# Patient Record
Sex: Female | Born: 1975 | Hispanic: No | Marital: Single
Health system: Northeastern US, Academic
[De-identification: ages and names within clinical notes are randomized; demographics above are authoritative.]

---

## 2019-05-16 LAB — COMPREHENSIVE METABOLIC PANEL
BKR ALANINE AMINOTRANSFERASE (ALT): 14 U/L (ref 13–56)
BKR ALBUMIN: 4.3 g/dL (ref 3.4–5.0)
BKR ALKALINE PHOSPHATASE: 87 U/L (ref 45–117)
BKR ANION GAP (LM): 9 mmol/L (ref 5–15)
BKR ASPARTATE AMINOTRANSFERASE (AST): 9 U/L — ABNORMAL LOW (ref 15–37)
BKR BILIRUBIN TOTAL: 0.2 mg/dL (ref <1.0)
BKR BLOOD UREA NITROGEN: 7 mg/dL (ref 7–18)
BKR CALCIUM: 8.9 mg/dL (ref 8.5–10.1)
BKR CHLORIDE: 102 mmol/L (ref 98–107)
BKR CO2: 26 mmol/L (ref 21–32)
BKR CREATININE: 0.76 mg/dL (ref 0.55–1.02)
BKR EGFR (AFR AMER) (LMC): >60 mL/min/{1.73_m2} (ref >60)
BKR EGFR (NON AFR AMER) (LMC): >60 mL/min/{1.73_m2} (ref >60)
BKR GLOBULIN: 3.8 g/dL (ref 2.5–5.0)
BKR GLUCOSE: 158 mg/dL — ABNORMAL HIGH (ref 65–110)
BKR POTASSIUM: 3.3 mmol/L — ABNORMAL LOW (ref 3.5–5.1)
BKR PROTEIN TOTAL: 8.1 g/dL (ref 6.4–8.2)
BKR SODIUM: 137 mmol/L (ref 136–145)

## 2019-05-16 LAB — CBC WITH AUTO DIFFERENTIAL
BKR WAM ABSOLUTE IMMATURE GRANULOCYTES.: 0.04 x 1000/ÂµL (ref 0.00–0.10)
BKR WAM ABSOLUTE LYMPHOCYTE COUNT.: 3.11 x 1000/ÂµL (ref 1.00–4.50)
BKR WAM ABSOLUTE NEUTROPHIL COUNT.: 11.37 x 1000/ÂµL — ABNORMAL HIGH (ref 1.40–6.60)
BKR WAM BASOPHIL ABSOLUTE COUNT.: 0.07 x 1000/ÂµL (ref 0.0–0.3)
BKR WAM BASOPHILS: 0.5 % (ref 0.0–3.0)
BKR WAM EOSINOPHIL ABSOLUTE COUNT.: 0.16 x 1000/ÂµL (ref 0.00–0.45)
BKR WAM EOSINOPHILS: 1 % (ref 0.0–4.0)
BKR WAM HEMATOCRIT: 40 % (ref 34.0–45.0)
BKR WAM HEMOGLOBIN: 13.3 g/dL (ref 11.0–15.0)
BKR WAM IMMATURE GRANULOCYTES: 0.3 % (ref 0.0–1.0)
BKR WAM LYMPHOCYTES: 20 % — ABNORMAL LOW (ref 25.0–45.0)
BKR WAM MCH PG: 29.6 pg (ref 27–33)
BKR WAM MCHC: 33.3 g/dL (ref 32.0–36.0)
BKR WAM MCV: 88.9 fL (ref 79.0–99.0)
BKR WAM MONOCYTE ABSOLUTE COUNT.: 0.78 x 1000/ÂµL (ref 0.00–1.20)
BKR WAM MONOCYTES: 5 % (ref 0.0–12.0)
BKR WAM MPV: 8.8 fL (ref 7.5–11.5)
BKR WAM NEUTROPHILS: 73.2 % — ABNORMAL HIGH (ref 36.0–66.0)
BKR WAM NUCLEATED RED BLOOD CELLS: 0 % (ref 0.0–5.0)
BKR WAM PLATELETS: 407 x1000/ÂµL — ABNORMAL HIGH (ref 140–400)
BKR WAM RDW-CV: 14 % (ref 11.5–14.5)
BKR WAM RED BLOOD CELL COUNT.: 4.5 M/ÂµL (ref 4.00–5.20)
BKR WAM WHITE BLOOD CELL COUNT.: 15.53 x1000/ÂµL — ABNORMAL HIGH (ref 4.00–10.00)

## 2019-05-16 LAB — URINALYSIS WITH CULTURE REFLEX      (BH LMW YH)
BKR BILIRUBIN, UA MG/DL: NEGATIVE mg/dL
BKR GLUCOSE, UA MG/DL: NEGATIVE mg/dL
BKR KETONES, UA MG/DL: NEGATIVE mg/dL
BKR LEUKOCYTE ESTERASE, UA: NEGATIVE Leu/uL
BKR NITRITE, UA: NEGATIVE
BKR PH, UA: 8 — ABNORMAL HIGH (ref 5.0–7.0)
BKR PROTEIN, UA.: NEGATIVE mg/dL
BKR RBC/HPF INSTRUMENT: 3 /HPF (ref 0–3)
BKR SPECIFIC GRAVITY, UA: 1.006 (ref 1.003–1.035)
BKR URINE SQUAMOUS EPITHELIAL CELLS, UA (NUMERIC): 7 /HPF — ABNORMAL HIGH (ref 0–5)
BKR UROBILINOGEN, UA - ALPHA: NEGATIVE EU/dL
BKR WBC/HPF INSTRUMENT: <1 /HPF (ref 0–5)

## 2019-05-16 LAB — LIPASE: BKR LIPASE: 6738 U/L — ABNORMAL HIGH (ref 73–393)

## 2019-05-16 LAB — LIPID PANEL
BKR CHOLESTEROL: 183 mg/dL
BKR HDL CHOLESTEROL: 38 mg/dL — ABNORMAL LOW
BKR LDL CHOLESTEROL CALCULATED: 104 mg/dL — ABNORMAL HIGH
BKR TRIGLYCERIDES: 204 mg/dL — ABNORMAL HIGH

## 2019-05-16 LAB — SARS COV-2 (COVID-19) RNA: BKR SARS-COV-2 RNA (COVID-19) (YH): NEGATIVE

## 2019-05-16 LAB — HCG, SERUM, QUALITATIVE (BH GH L LMW): BKR SERUM PREGNANCY-QUALITATIVE: NEGATIVE

## 2019-05-17 LAB — LIPASE: BKR LIPASE: 99 U/L (ref 73–393)

## 2019-05-17 LAB — COMPREHENSIVE METABOLIC PANEL
BKR ALANINE AMINOTRANSFERASE (ALT): 18 U/L (ref 13–56)
BKR ALBUMIN: 3.8 g/dL (ref 3.4–5.0)
BKR ALKALINE PHOSPHATASE: 80 U/L (ref 45–117)
BKR ANION GAP (LM): 6 mmol/L (ref 5–15)
BKR ASPARTATE AMINOTRANSFERASE (AST): 15 U/L (ref 15–37)
BKR BILIRUBIN TOTAL: 0.3 mg/dL (ref <1.0)
BKR BLOOD UREA NITROGEN: 4 mg/dL — ABNORMAL LOW (ref 7–18)
BKR CALCIUM: 9.5 mg/dL (ref 8.5–10.1)
BKR CHLORIDE: 106 mmol/L (ref 98–107)
BKR CO2: 24 mmol/L (ref 21–32)
BKR CREATININE: 0.61 mg/dL (ref 0.55–1.02)
BKR EGFR (AFR AMER) (LMC): >60 mL/min/{1.73_m2} (ref >60)
BKR EGFR (NON AFR AMER) (LMC): >60 mL/min/{1.73_m2} (ref >60)
BKR GLOBULIN: 3.6 g/dL (ref 2.5–5.0)
BKR GLUCOSE: 89 mg/dL (ref 65–110)
BKR POTASSIUM: 4.2 mmol/L (ref 3.5–5.1)
BKR PROTEIN TOTAL: 7.4 g/dL (ref 6.4–8.2)
BKR SODIUM: 136 mmol/L (ref 136–145)

## 2019-05-17 LAB — CBC WITH AUTO DIFFERENTIAL
BKR WAM ABSOLUTE IMMATURE GRANULOCYTES.: 0.03 x 1000/ÂµL (ref 0.00–0.10)
BKR WAM ABSOLUTE LYMPHOCYTE COUNT.: 2.55 x 1000/ÂµL (ref 1.00–4.50)
BKR WAM ABSOLUTE NEUTROPHIL COUNT.: 7.03 x 1000/ÂµL — ABNORMAL HIGH (ref 1.40–6.60)
BKR WAM BASOPHIL ABSOLUTE COUNT.: 0.07 x 1000/ÂµL (ref 0.0–0.3)
BKR WAM BASOPHILS: 0.7 % (ref 0.0–3.0)
BKR WAM EOSINOPHIL ABSOLUTE COUNT.: 0.19 x 1000/ÂµL (ref 0.00–0.45)
BKR WAM EOSINOPHILS: 1.8 % (ref 0.0–4.0)
BKR WAM HEMATOCRIT: 37.6 % (ref 34.0–45.0)
BKR WAM HEMOGLOBIN: 12.1 g/dL (ref 11.0–15.0)
BKR WAM IMMATURE GRANULOCYTES: 0.3 % (ref 0.0–1.0)
BKR WAM LYMPHOCYTES: 24.7 % — ABNORMAL LOW (ref 25.0–45.0)
BKR WAM MCH PG: 29.7 pg (ref 27–33)
BKR WAM MCHC: 32.2 g/dL (ref 32.0–36.0)
BKR WAM MCV: 92.4 fL (ref 79.0–99.0)
BKR WAM MONOCYTE ABSOLUTE COUNT.: 0.44 x 1000/ÂµL (ref 0.00–1.20)
BKR WAM MONOCYTES: 4.3 % (ref 0.0–12.0)
BKR WAM MPV: 9 fL (ref 7.5–11.5)
BKR WAM NEUTROPHILS: 68.2 % — ABNORMAL HIGH (ref 36.0–66.0)
BKR WAM NUCLEATED RED BLOOD CELLS: 0 % (ref 0.0–5.0)
BKR WAM PLATELETS: 333 x1000/ÂµL (ref 140–400)
BKR WAM RDW-CV: 13.8 % (ref 11.5–14.5)
BKR WAM RED BLOOD CELL COUNT.: 4.07 M/ÂµL (ref 4.00–5.20)
BKR WAM WHITE BLOOD CELL COUNT.: 10.31 x1000/ÂµL — ABNORMAL HIGH (ref 4.00–10.00)

## 2019-05-26 LAB — CANCER ANTIGEN 19-9: CA 19-9: 16 U/mL (ref <34)

## 2019-05-26 LAB — IMMUNOGLOBULIN G SUBCLASS 4     (BH GH LMW Q YH): IMMUNOGLOBULIN G SUBCLASS 4: 13.4 mg/dL (ref 4.0–86.0)

## 2019-06-01 LAB — PANCREATIC ELASTASE-1     (BH GH LMW Q YH): PANCREATIC ELASTASE-1: 375 mcg/g

## 2019-06-02 ENCOUNTER — Ambulatory Visit: Admit: 2019-06-02 | Payer: PRIVATE HEALTH INSURANCE

## 2019-06-16 ENCOUNTER — Encounter: Admit: 2019-06-16 | Payer: PRIVATE HEALTH INSURANCE | Attending: Adult Health

## 2019-06-16 ENCOUNTER — Ambulatory Visit: Admit: 2019-06-16 | Payer: PRIVATE HEALTH INSURANCE | Attending: Family

## 2019-06-16 DIAGNOSIS — K852 Alcohol induced acute pancreatitis without necrosis or infection: Secondary | ICD-10-CM

## 2019-06-16 DIAGNOSIS — F17200 Nicotine dependence, unspecified, uncomplicated: Secondary | ICD-10-CM

## 2019-06-16 DIAGNOSIS — K863 Pseudocyst of pancreas: Secondary | ICD-10-CM

## 2019-06-16 DIAGNOSIS — Z01818 Encounter for other preprocedural examination: Secondary | ICD-10-CM

## 2019-06-16 NOTE — Progress Notes
VIDEO TELEHEALTH VISIT: This clinician is part of the telehealth program and is conducting this visit in a currently approved location. For this visit the clinician and patient were present via interactive audio & video telecommunications system that permits real-time communications.Patient/parent or guardian consent given for video visit: Yes State patient is located in: [place]The clinician is appropriately licensed in the above state to provide care for this visit. Other individuals present during the telehealth encounter and their role/relation: noneIf billing based on time, please complete (Not required if billing based on MDM):                           Total time spent in medical video consultation: ; Total time spent by the provider on the day of service, which includes time spent on chart review, medical video consultation, education, coordination of care/services and counseling Because this visit was completed over video, a hands-on physical exam was not performed.  Patient/parent or guardian understands and knows to call back if condition changes.Natasha English  1. Alcohol-induced acute pancreatitis without infection or necrosis  K85.20  2. Pseudocyst of pancreas  K86.3  3. Tobacco dependence  F17.200   HPI: Natasha English is a 44 y.o. female being interviewed today due to 1. Alcohol-induced acute pancreatitis without infection or necrosis   2. Pseudocyst of pancreas   3. Tobacco dependence    Pt reports that she was drinking heavily for a couple years starting about 3-4 years ago.  She began with attacks of acute pancreatitis in April 2019.  She cut down drinking in Dec 2019 and has since stopped completely.  She has been admitted with pancreatitis 7-8x since the attacks began (2019).  She has already been admitted 2x since Dec 2020.  Her pain resolves completely between attacks. She has no steattorea or problems with glucose control.  She reports her last HgbA1c at 5.8.  She has no personal history of pancreas issues prior to April 2019.  No family history of pancreas issues.She does have a history of high triglicerides that is being treated effectively with medication.IgG4 normalCa-19-9 normalCyst in head of pancreas was first seen in pancreas in April 2020 at 1.5cm.  It has now grown to 2.0cm.  She has had multiple episodes of pancreatitis within this time.  Pt no longer drinks alcohol but does admit to smoking a few cigarettes per day.  She is trying to cut down.Medical History: PMH PSH Past Medical History: Diagnosis Date ? Essential hypertension 10/12/2009 ? High cholesterol  ? History of abnormal cervical Papanicolaou smear  ? History of pulmonary embolism  ? Pancreatitis  ? Paroxysmal ventricular tachycardia (HC Code) 10/12/2009  RT OUTFLOW TRACT  Past Surgical History: Procedure Laterality Date ? CARDIAC ELECTROPHYSIOLOGY MAPPING AND ABLATION  04/23/2008  [place] ? CERVICAL CONE BIOPSY   ? CESAREAN SECTION   ? MIBI Exercise stress/test (NM)  10/08/2007  [facility]&M ? MOUTH SURGERY   ? TONSILLECTOMY   ? TUBAL LIGATION    Social History Family History Social History Tobacco Use ? Smoking status: Current Some Day Smoker ? Smokeless tobacco: Never Used Substance Use Topics ? Alcohol use: Yes   Comment: daily 2-3 drinks/day  Family History Problem Relation Age of Onset ? Heart disease Father       Myocardial Infarction ? No Known Problems Mother  ? Breast cancer Maternal Grandmother   Medications Current Outpatient Medications (CARDIAC DRUGS)  Medication Sig ? amLODIPine Take 1 tablet (10 mg total) by mouth daily. Current Outpatient Medications (CARDIOVASCULAR) Medication Sig ? atorvastatin Take 20 mg by mouth daily.. Current Outpatient Medications (CNS DRUGS) Medication Sig ? lamoTRIgine Take 100 mg by mouth daily. With 50 mg total dose = 150 mg daily Current Outpatient Medications (PSYCHOTHERAPEUTIC DRUGS) Medication Sig ? buPROPion SR Take 300 mg by mouth daily.  Current Outpatient Medications (GASTROINTESTINAL) Medication Sig ? ondansetron Place 4 mg onto the tongue every 8 (eight) hours as needed for nausea. ? pantoprazole Take 1 tablet (40 mg total) by mouth every 12 (twelve) hours. (Patient taking differently: Take 40 mg by mouth daily. ) ? Vascepa TAKE 2 TABLETS BY MOUTH TWICE A DAY Current Outpatient Medications (PRE-NATAL VITAMINS) Medication Sig ? Prenatal Vitamin Plus Low Iron Take 1 tablet by mouth daily. *OTC   Allergies Allergies Allergen Reactions ? Lisinopril Swelling   Angioedema on May 11, 2018 ? Hydrochlorothiazide    Electrolyte imbalance  Family History: non-contributoryReview of Systems: ROS:   No SOB or CPNo fever, weight lossNo abdominal pain, diarrhea or constipationNo rashNo arthralgias or myalgiasNo behavioral issuesNo headache or weaknessObjective: Limited physical exam was performed due to teleheatlh visit. There were no vitals filed for this visit.  Physical Exam:Gen: pleasant in NADRespirations: normal effortNeuro: AxOx3 Skin shows no signs of jaundiceResults for orders placed or performed in visit on 05/24/19 Pancreatic elastase-1     (BH GH LMW Q YH) Result Value Ref Range  Pancreatic Elastase-1 375 mcg/g [place] ABDOMEN W WO IV CONTRAST?Natasha English       SexCarmon English  DOB: [id number]  MRN: [id number]?Service Date: 2019-04-16 14:55:47?[place]mer of the abdomen without and with nonionic intravenous contrast - oral contrast: Yes?Radiation dose lowering was achieved by the use of an iterative reconstruction technique.?HISTORY: Pancreatitis suspected?COMPARISON: 21 October 2018 and 02 August 2018 MRI studies of the abdomen, and 01 August 2018, 05 May 2018 and 01 Sep 2017 [place] studies of the abdomen/pelvis.?Pre IV contrast:  No apparent visceral calcification or finding to suggest hemorrhage.?Lung bases: Clear. The heart is normal in size.?Liver: Unremarkable.?Spleen: Unremarkable.?Pancreas: There is mild infiltration of the mesenteric fat along the head and body region, consistent with pancreatitis. The previously noted rounded hypodensity in the head region, potentially representing a pseudocyst, now measures 2.0 cm in maximum diameter, compared with 1.8 cm on the June 2020 and 1.5 cm on the April 2020 MRI studies. The pancreatic duct remains moderately distended.?Gallbladder: Unremarkable.?Kidneys: Enhance symmetrically, without findings to suggest obstruction or mass. ?Adrenals/lymph nodes: No adrenal mass or lymphadenopathy.?Mesentery/vessels: A small umbilical hernia contains only fat.?Skin/bones/subcutaneous tissues: No apparent aggressive bony abnormality.?Visualized bowel and pelvis: Unremarkable. What appears to be the partially visualized appendix appears normal.?IMPRESSION:?Findings consistent with mild, uncomplicated pancreatitis.?Mild interval enlargement of a pancreatic head hypodensity, potentially representing a pseudocyst. A follow-up endoscopic ultrasound and biopsy should be considered because of the possibility of malignancy. Alternatively, a follow-up MRI of the abdomen without and with intravenous contrast, focusing on the pancreas, may be helpful.?Estimated radiation exposure: 422.79 mGy.cm?[id number]?Signed By: Nelva Bush MD, 2019/04/16 16:21:21Assessment: Natasha English is a 44 y.o. female being interviewed today for 1. Alcohol-induced acute pancreatitis without infection or necrosis   2. Pseudocyst of pancreas   3. Tobacco dependence   Plan:   English  1. Alcohol-induced acute pancreatitis without infection or necrosis  K85.20  2. Pseudocyst of pancreas  K86.3  3. Tobacco dependence  F17.200   -Recommendations: 1. 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  2.  Pancreas cysts discussed in detail with patient including common kinds including but not limited to: Serous cysts,- contains simple fluid, no malignant potential, more common in middle aged women.  Can grow with time and press on surrounding organs-- only at that time would we consider intervention.   Post inflammatory or pseudocysts - can occur after pancreatitis when body is trying to reorganize and absorb inflammatory fluid from pancreas,  No malignant potential, body tends to reabsorb over time, occasionally requires intervention.   Mucinous cysts-  includes MCN or IPMN (intrapappillary mucinous neoplasm), malignant potential, filled with mucin- thick sticky liquid.  We worry when there are concerning features such as nodular appearance, involvement of main duct of pancreas or high growth rate over time.  If we see concerning features, we may advise intervention such as surgery.Because pancreas surgery is very complicated, benefits and risks must be weighed and considered before surgery would ever be advised.  At this time, there are no worrisome features to suggest surgery would benefit currently.  We will gather more information to determine the nature of this cyst.    Recommend we proceed with [facility] within the next 2 weeks so that we can get more data to help determine cyst features and what surveillance or treatment path would be.  Risks and benefits of EUS discussed with pt including but not limited to bleeding, perforation, infection, pancreatitis and reaction to sedation.  Pt is agreeable to these risks and wishes to proceed. Once EUS/FNA is performed we will have better idea what kind of cyst this is and if any worrisome features are seen that would suggest surgery, vs no worrisome features and would suggest surveillance with imaging.  3. I discussed tobacco cessation with the pt, she is already cutting down to a few cigarettes each day and is on Wellbutrin.  Reminded her that the tobacco can contribute to the irritation/scarring on pancreas for those who have already had pancreas issues.  She agrees this is another good reason to continue cutting down.  *I will ask our staff to send cardiac clearance to Dr Grier Rocher due to pt's history of paroxismal vtach and chest pain.*Pt is scheduled for the EUS with Dr Mauro Kaufmann on 2/26, she will need a telehealth visit with me 1 week following to discuss results and recommendations based on the procedure.Thank you for the kind referral.  We greatly appreciate the opportunity to assist in your patient's care.  If there are further questions, please do not hesitate to contact us.Best Regards,Electronically Signed by Daliah Chaudoin [place] Jan Olano, APRN, February 15, 2021[facility] APRN MSN/MPHNurse Practitioner Clinical CoordinatorAdvanced EndoscopyDigestive DiseaseYale UniversityPhone [phone]

## 2019-06-17 ENCOUNTER — Encounter: Admit: 2019-06-17 | Payer: PRIVATE HEALTH INSURANCE

## 2019-06-17 ENCOUNTER — Inpatient Hospital Stay: Admit: 2019-06-17 | Discharge: 2019-06-17 | Payer: BLUE CROSS/BLUE SHIELD

## 2019-06-17 DIAGNOSIS — Z8742 Personal history of other diseases of the female genital tract: Secondary | ICD-10-CM

## 2019-06-17 DIAGNOSIS — I4729 Paroxysmal ventricular tachycardia (HC Code): Secondary | ICD-10-CM

## 2019-06-17 DIAGNOSIS — R928 Other abnormal and inconclusive findings on diagnostic imaging of breast: Secondary | ICD-10-CM

## 2019-06-17 DIAGNOSIS — N6489 Other specified disorders of breast: Secondary | ICD-10-CM

## 2019-06-17 DIAGNOSIS — I10 Essential (primary) hypertension: Secondary | ICD-10-CM

## 2019-06-17 DIAGNOSIS — Z86711 Personal history of pulmonary embolism: Secondary | ICD-10-CM

## 2019-06-17 DIAGNOSIS — K859 Acute pancreatitis without necrosis or infection, unspecified: Secondary | ICD-10-CM

## 2019-06-17 DIAGNOSIS — E7800 Pure hypercholesterolemia, unspecified: Secondary | ICD-10-CM

## 2019-06-24 ENCOUNTER — Encounter: Admit: 2019-06-24 | Payer: PRIVATE HEALTH INSURANCE | Attending: Gastroenterology

## 2019-06-24 ENCOUNTER — Inpatient Hospital Stay: Admit: 2019-06-24 | Discharge: 2019-06-24 | Payer: BLUE CROSS/BLUE SHIELD

## 2019-06-24 DIAGNOSIS — I2699 Other pulmonary embolism without acute cor pulmonale: Secondary | ICD-10-CM

## 2019-06-24 DIAGNOSIS — Z01812 Encounter for preprocedural laboratory examination: Secondary | ICD-10-CM

## 2019-06-24 DIAGNOSIS — K859 Acute pancreatitis without necrosis or infection, unspecified: Secondary | ICD-10-CM

## 2019-06-24 DIAGNOSIS — Z01818 Encounter for other preprocedural examination: Secondary | ICD-10-CM

## 2019-06-24 DIAGNOSIS — I4729 Paroxysmal ventricular tachycardia (HC Code): Secondary | ICD-10-CM

## 2019-06-24 DIAGNOSIS — Z8742 Personal history of other diseases of the female genital tract: Secondary | ICD-10-CM

## 2019-06-24 DIAGNOSIS — Z20822 Contact with and (suspected) exposure to covid-19: Secondary | ICD-10-CM

## 2019-06-24 DIAGNOSIS — E7800 Pure hypercholesterolemia, unspecified: Secondary | ICD-10-CM

## 2019-06-24 DIAGNOSIS — K862 Cyst of pancreas: Secondary | ICD-10-CM

## 2019-06-24 DIAGNOSIS — I10 Essential (primary) hypertension: Secondary | ICD-10-CM

## 2019-06-25 ENCOUNTER — Encounter: Admit: 2019-06-25 | Payer: PRIVATE HEALTH INSURANCE | Attending: Gastroenterology

## 2019-06-25 ENCOUNTER — Ambulatory Visit: Admit: 2019-06-25 | Payer: PRIVATE HEALTH INSURANCE | Attending: Family

## 2019-06-25 DIAGNOSIS — R569 Unspecified convulsions: Secondary | ICD-10-CM

## 2019-06-25 DIAGNOSIS — I10 Essential (primary) hypertension: Secondary | ICD-10-CM

## 2019-06-25 DIAGNOSIS — K859 Acute pancreatitis without necrosis or infection, unspecified: Secondary | ICD-10-CM

## 2019-06-25 DIAGNOSIS — Z5189 Encounter for other specified aftercare: Secondary | ICD-10-CM

## 2019-06-25 DIAGNOSIS — K862 Cyst of pancreas: Secondary | ICD-10-CM

## 2019-06-25 DIAGNOSIS — I2699 Other pulmonary embolism without acute cor pulmonale: Secondary | ICD-10-CM

## 2019-06-25 DIAGNOSIS — Z8742 Personal history of other diseases of the female genital tract: Secondary | ICD-10-CM

## 2019-06-25 DIAGNOSIS — E7800 Pure hypercholesterolemia, unspecified: Secondary | ICD-10-CM

## 2019-06-25 DIAGNOSIS — K861 Other chronic pancreatitis: Secondary | ICD-10-CM

## 2019-06-25 DIAGNOSIS — I4729 Paroxysmal ventricular tachycardia (HC Code): Secondary | ICD-10-CM

## 2019-06-25 LAB — COVID-19 CLEARANCE OR FOR PLACEMENT ONLY: BKR SARS-COV-2 RNA (COVID-19) (YH): NEGATIVE

## 2019-06-25 NOTE — Other
Pt scheduled for a procedure at [facility] on 06/27/[facility]. Pre procedure call complete. Covid test complete Pt instructed to take Lamictal,Amlodipine,Protonix and Wellbutrin by 7am morning of procedure with sip H2O   Discharge transportation policy and NPO instructions given per pre procedure guidelines Verbalizing understanding of instructions. Encouraged to call back with any questions.

## 2019-06-25 NOTE — Other
ReviewReview NP: Otilio Saber

## 2019-06-27 ENCOUNTER — Ambulatory Visit: Admit: 2019-06-27 | Payer: PRIVATE HEALTH INSURANCE | Attending: Family

## 2019-06-27 ENCOUNTER — Inpatient Hospital Stay: Admit: 2019-06-27 | Discharge: 2019-06-27 | Payer: BLUE CROSS/BLUE SHIELD

## 2019-06-27 ENCOUNTER — Encounter: Admit: 2019-06-27 | Payer: PRIVATE HEALTH INSURANCE | Attending: Gastroenterology

## 2019-06-27 DIAGNOSIS — E7800 Pure hypercholesterolemia, unspecified: Secondary | ICD-10-CM

## 2019-06-27 DIAGNOSIS — F329 Major depressive disorder, single episode, unspecified: Secondary | ICD-10-CM

## 2019-06-27 DIAGNOSIS — I4729 Paroxysmal ventricular tachycardia (HC Code): Secondary | ICD-10-CM

## 2019-06-27 DIAGNOSIS — I10 Essential (primary) hypertension: Secondary | ICD-10-CM

## 2019-06-27 DIAGNOSIS — F17210 Nicotine dependence, cigarettes, uncomplicated: Secondary | ICD-10-CM

## 2019-06-27 DIAGNOSIS — K862 Cyst of pancreas: Secondary | ICD-10-CM

## 2019-06-27 DIAGNOSIS — F988 Other specified behavioral and emotional disorders with onset usually occurring in childhood and adolescence: Secondary | ICD-10-CM

## 2019-06-27 DIAGNOSIS — Z888 Allergy status to other drugs, medicaments and biological substances status: Secondary | ICD-10-CM

## 2019-06-27 DIAGNOSIS — I2699 Other pulmonary embolism without acute cor pulmonale: Secondary | ICD-10-CM

## 2019-06-27 DIAGNOSIS — Z79899 Other long term (current) drug therapy: Secondary | ICD-10-CM

## 2019-06-27 DIAGNOSIS — Z5189 Encounter for other specified aftercare: Secondary | ICD-10-CM

## 2019-06-27 DIAGNOSIS — Z8742 Personal history of other diseases of the female genital tract: Secondary | ICD-10-CM

## 2019-06-27 DIAGNOSIS — R569 Unspecified convulsions: Secondary | ICD-10-CM

## 2019-06-27 DIAGNOSIS — K8689 Other specified diseases of pancreas: Secondary | ICD-10-CM

## 2019-06-27 DIAGNOSIS — K861 Other chronic pancreatitis: Secondary | ICD-10-CM

## 2019-06-27 DIAGNOSIS — Z86711 Personal history of pulmonary embolism: Secondary | ICD-10-CM

## 2019-06-27 DIAGNOSIS — K859 Acute pancreatitis without necrosis or infection, unspecified: Secondary | ICD-10-CM

## 2019-06-27 MED ORDER — FENTANYL (PF) 50 MCG/ML INJECTION SOLUTION
50 mcg/mL | Status: DC | PRN
Start: 2019-06-27 — End: 2019-06-27
  Administered 2019-06-27 (×2): 50 mcg/mL via INTRAVENOUS

## 2019-06-27 MED ORDER — SIMETHICONE 40 MG/0.6 ML ORAL DROPS,SUSPENSION
400.6 mg/0.6 mL | Status: DC | PRN
Start: 2019-06-27 — End: 2019-06-27

## 2019-06-27 MED ORDER — PROPOFOL 10 MG/ML INTRAVENOUS EMULSION
10 mg/mL | Status: DC | PRN
Start: 2019-06-27 — End: 2019-06-27
  Administered 2019-06-27: 18:00:00 10 mL/h via INTRAVENOUS

## 2019-06-27 MED ORDER — SODIUM CHLORIDE 0.9 % INTRAVENOUS SOLUTION
Status: DC | PRN
Start: 2019-06-27 — End: 2019-06-27
  Administered 2019-06-27: 18:00:00 via INTRAVENOUS

## 2019-06-27 MED ORDER — SODIUM CHLORIDE 0.9 % (FLUSH) INJECTION SYRINGE
0.9 % | Freq: Three times a day (TID) | INTRAVENOUS | Status: DC
Start: 2019-06-27 — End: 2019-06-27

## 2019-06-27 MED ORDER — FENTANYL (PF) 50 MCG/ML INJECTION SOLUTION
50 mcg/mL | Status: CP
Start: 2019-06-27 — End: ?

## 2019-06-27 MED ORDER — LACTATED RINGERS INTRAVENOUS SOLUTION
INTRAVENOUS | Status: DC
Start: 2019-06-27 — End: 2019-06-27

## 2019-06-27 MED ORDER — GLYCOPYRROLATE 0.2 MG/ML INJECTION SOLUTION
0.2 mg/mL | Status: DC | PRN
Start: 2019-06-27 — End: 2019-06-27
  Administered 2019-06-27: 18:00:00 0.2 mg/mL via INTRAVENOUS

## 2019-06-27 MED ORDER — PROPOFOL 10 MG/ML INTRAVENOUS EMULSION
10 mg/mL | Status: CP
Start: 2019-06-27 — End: ?

## 2019-06-27 MED ORDER — LIDOCAINE (PF) 20 MG/ML (2 %) INJECTION SOLUTION
20 mg/mL (2 %) | Status: DC | PRN
Start: 2019-06-27 — End: 2019-06-27
  Administered 2019-06-27: 18:00:00 20 mg/mL (2 %) via INTRAVENOUS

## 2019-06-27 MED ORDER — PROPOFOL 10 MG/ML INTRAVENOUS EMULSION
10 mg/mL | Status: DC | PRN
Start: 2019-06-27 — End: 2019-06-27
  Administered 2019-06-27 (×5): 10 mg/mL via INTRAVENOUS

## 2019-06-27 MED ORDER — SODIUM CHLORIDE 0.9 % (FLUSH) INJECTION SYRINGE
0.9 % | Status: DC | PRN
Start: 2019-06-27 — End: 2019-06-27

## 2019-06-27 NOTE — Other
Post Anesthesia Transfer of Care NotePatient: Natasha L SochaProcedure(s) Performed: Procedure(s) (LRB):EUS (N/A) Patient location: PACU Last Vitals: Vitals Value Taken Time BP 118/63 06/27/19 1345 Temp 36.4 ?C 06/27/19 1345 Pulse 81 06/27/19 1346 Resp 14 06/27/19 1346 SpO2 99 % 06/27/19 1346 Vitals shown include unvalidated device data.Level of consciousness: awakeTransport Vital Signs:  Stable since the last set of recorded intra-operative vital signsComplications: noneIntra-operative Intake & Output and Antibiotics as per Anesthesia record and discussed with the RN.

## 2019-06-27 NOTE — Discharge Instructions
Endoscopy & ColonoscopyAfter Care In case of any problems after your procedure, please call [phone] or go to the nearest Emergency Room for any urgent or severe symptoms. Please call for biopsy or cytology results within 5-7 days [phone] see copy of procedure report.HOME CARE INSTRUCTIONSActivity?	You may resume your regular activity but take frequent rest periods for the next 24 hours.?	You may experience abdominal discomfort such as a feeling of fullness or gas pains.?	Walking will help expel (get rid of) the air and reduce the bloated feeling in your abdomen.?	You may experience a sore throat for 2 to 3 days (if you had an upper endoscopy). This is normal. Gargling with salt water may help this.?	You may shower.?	A companion MUST drive you home, as the sedation impairs your reflexes and judgments.?	For the remainder of the day, you should not operate any vehicle or heavy machinery or make important decisions due to the sedation.  We recommend resting quietly.Nutrition?	Drink plenty of fluids.?	Begin with a light meal and progress to your normal diet as tolerated.?	Avoid alcoholic beverages for 24 hours or as instructed by your caregiver.Medications?	You may resume your normal medications unless your caregiver tells you otherwise.Follow-up* Please check out with the front desk [phone] before leaving to see if and when your follow up should be scheduled. You will be called within 5-7 business days to schedule a telehealth appointment with our clinicians to review the biopsy/cytology results of your procedure.  If you do not hear from anyone or if you are concerned, please call  [phone].SEEK IMMEDIATE MEDICAL CARE IF:?	You have excessive nausea (feeling sick to your stomach) and/or vomiting.?	You have severe abdominal pain and distention (swelling).?	You have trouble swallowing.?	You have a temperature over 100? F (37.8? C).?	You have rectal bleeding, black tarry stools or vomiting of blood.?	You are unable to urinate or pass bowel movements.Drs. Mauro Kaufmann, Kerby Less & Muniraj want you to have the best procedure experience possible.Please call with any questions, concerns or problems.M-F 8:30 am-4:30 pm at [phone]________________________________________________________________________July 2017You had a cyst in the head of the panceas probably from pancreatitis., Your pancreatic duct is enlarged. We would like to repeat your MRI in 2 months and see you in the office\

## 2019-06-27 NOTE — H&P
[facility]	 [facility]	Gastroenterology History & PhysicalHPI: Pancreatic cystMedical History: PMH PSH Past Medical History: Diagnosis Date ? Encounter for blood transfusion   during 2nd pregnancy ? Essential hypertension 10/12/2009 ? High cholesterol  ? History of abnormal cervical Papanicolaou smear  ? Pancreatic cyst 07/2018 ? Pancreatitis  ? Paroxysmal ventricular tachycardia (HC Code) 10/12/2009  RT OUTFLOW TRACT/ ablations x 6 ? Pulmonary embolism (HC Code) 2008  no blood thinners at this time ? Seizures (HC Code)   during pregnancy  Past Surgical History: Procedure Laterality Date ? CARDIAC ELECTROPHYSIOLOGY MAPPING AND ABLATION  04/23/2008  [place] ? CERVICAL CONE BIOPSY   ? CESAREAN SECTION   ? MIBI Exercise stress/test (NM)  10/08/2007  [facility]&M ? MOUTH SURGERY   ? TONSILLECTOMY   ? TUBAL LIGATION   ? UPPER GASTROINTESTINAL ENDOSCOPY    Social History Family History Social History Tobacco Use ? Smoking status: Current Some Day Smoker   Packs/day: 0.50   Types: Cigarettes ? Smokeless tobacco: Never Used Substance Use Topics ? Alcohol use: Not Currently   Comment: daily 2-3 drinks/day  Family History Problem Relation Age of Onset ? Heart disease Father       Myocardial Infarction ? No Known Problems Mother  ? Breast cancer Maternal Grandmother   Prior to Admission Medications Medications Prior to Admission Medication Sig Dispense Refill Last Dose ? amLODIPine (NORVASC) 10 mg tablet Take 1 tablet (10 mg total) by mouth daily. 30 tablet 0  ? atorvastatin (LIPITOR) 20 MG tablet Take 20 mg by mouth daily.Marland Kitchen  0  ? buPROPion SR (WELLBUTRIN SR) 150 mg 12 hr tablet Take 300 mg by mouth daily.     ? lamoTRIgine (LAMICTAL) 100 mg immediate release tablet Take 100 mg by mouth daily. With 50 mg total dose = 150 mg daily ? ondansetron (ZOFRAN-ODT) 4 mg disintegrating tablet Place 4 mg onto the tongue every 8 (eight) hours as needed for nausea.   Past Month at Unknown time ? pantoprazole (PROTONIX) 40 mg tablet Take 1 tablet (40 mg total) by mouth every 12 (twelve) hours. (Patient taking differently: Take 40 mg by mouth daily. ) 30 tablet 2  ? PRENATAL VITAMIN PLUS LOW IRON 27 mg iron- 1 mg Tab tablet Take 1 tablet by mouth daily. *OTC  3  ? VASCEPA 1 gram capsule TAKE 2 TABLETS BY MOUTH TWICE A DAY 120 capsule 12   Allergies Allergies Allergen Reactions ? Lisinopril Swelling   Angioedema on May 11, 2018 ? Hydrochlorothiazide Other (See Comments)   Electrolyte imbalance  Family History: non-contributoryReview of Systems:  ROS: no SOB or CPNo fever, weight lossNo abdominal pain, diarrhea or constipationNo rashNo arthralgias or myalgiasObjective: Vitals:Last 24 hours: Temp:  [98.1 ?F (36.7 ?C)] 98.1 ?F (36.7 ?C)Pulse:  [91] 91Resp:  [20] 20BP: (134)/(75) 134/75SpO2:  [97 %] 97 %Physical Exam: GEN: NAD, A&OCV: RRRRESP: CTABABD: Soft, NT, NDAssessment: Panc cyst, recurrent pancreatitisPlan: Signed:Lantz Hermann Karalee Height, MD 2/26/202112:28 PM

## 2019-06-27 NOTE — Other
Pt brought into Rm 1. Time out performed. Pt positioned left lateral. Bite block placed. Sedated per anesthesia. Pt underwent EUS.EGD performed first, followed by EUS.Pt tolerated procedure well. Transferred to PACU. Report to PACU RN.

## 2019-06-27 NOTE — Anesthesia Pre-Procedure Evaluation
This is a 44 y.o. female scheduled for EUS (N/A ).Review of Systems/ Medical HistoryPatient summary, nursing notes, EKG/Cardiac Studies , Labs, pre-procedure vitals, height, weight and NPO status reviewed.Anesthesia Evaluation: Estimated body mass index is 24.07 kg/m? as calculated from the following:  Height as of this encounter: 5' 2 (1.575 m).  Weight as of this encounter: 59.7 kg. CC/HPI: Pt is a 44 y.o. female PMH high cholesterol, HTN, paroxysmal ventricular tachycardia s/p ablation, seizures, PE, pancreatic cyst, and chronic pancreatitis scheduled for EUSOf note, admitted 1/15-1/16/21 for acute pancreatitis Past Surgical History:  CARDIAC ELECTROPHYSIOLOGY MAPPING AND ABLATION	MIBI Exercise stress/test (NM)CERVICAL CONE BIOPSYCESAREAN SECTIONTONSILLECTOMY	MOUTH SURGERYTUBAL LIGATION	UPPER GASTROINTESTINAL ENDOSCOPYCardiovascular:Patient has a history of: hypertension. -Exercise tolerance: >4 METS -Dysrhythmia(s): yes (Paroxysmal ventricular tachycardia s/p ablation)-Other Cardiovascular: pulmonary embolus.   . Cardiac clearance 06/19/19. Respiratory: -Lung Disorders: Patient has pulmonary embolus.  -Nicotine Dependence: yes (some day smoker)Neuromuscular:  Patient has a history of seizures.Gastrointestinal/Genitourinary: -Gastrointestinal Disorders:  Patient has pancreatitis.-Comments:  pancreas cyst that is persisting and growing 5mm since April 2020.  Abdominal U/S 1/15/21IMPRESSION: The pancreatic head and uncinate process are vaguely identified and there appears to be a mass-like density seen in this location which corresponds to [place] findings and findings may suggest acute pancreatitis however underlying mass is also possible given MRI findings of 10/21/2018 and additional workup is recommended. ? Pseudocyst is also possible. Common bile duct dilatation identified and this could be further assessed on MRI of the pancreas with MRCP protocol as wellBehavioral/Psychiatric & Syndromes:  Patient has attention deficit disorder and depression.-Comments: MarijuanaAdditional Findings: LABS 1/16/21H/H 12.1/37.6Plts 333KNa 136K 4.2Cr 0.61COVID Test 06/24/19 negativePhysical ExamCardiovascular:  normal exam  Rhythm: regularHeart Sounds: S1 present and S2 present.Pulmonary:  normal exam  Patient's breath sounds clear to auscultationAirway:  Mallampati: IITM distance: >3 FBNeck ROM: fullAnesthesia PlanASA 2 The primary anesthesia plan is  MAC. ASA monitors, antibiotics per surgeonPerioperative Code Status confirmed: It is my understanding that the patient is currently designated as 'Full Code' and will remain so throughout the perioperative period.Anesthesia informed consent obtained. Consent obtained from: patientThe post operative pain plan is per surgeon management.Plan discussed with CRNA.Anesthesiologist's Pre Op NoteI personally evaluated and examined the patient prior to the intra-operative phase of care.

## 2019-06-27 NOTE — Other
Operative Diagnosis:Pre-op:   Chronic pancrititisPatient Coded Diagnosis   Pre-op diagnosis: Chronic pancreatitis, unspecified pancreatitis type (HC Code)  Post-op diagnosis: Chronic pancreatitis, unspecified pancreatitis type (HC Code)  Patient Diagnosis   None    Post-op diagnosis:   * Chronic pancreatitis, unspecified pancreatitis type (HC Code) [Z61.1]Operative Procedure(s) :Procedure(s) (LRB):EUS (N/A)Post-op Procedure & Diagnosis ConfirmationPost-op Diagnosis: Post-op Diagnosis confirmed (no changes)Post-op Procedure: Post-op Procedure confirmed (no changes)

## 2019-06-27 NOTE — Anesthesia Post-Procedure Evaluation
Anesthesia Post-op NotePatient: Natasha L SochaProcedure(s):  Procedure(s) (LRB):EUS (N/A) Patient location: PACULast Vitals:  I have noted the vital signs as listed in the nursing notes.Mental status recovered: patient participates in evaluation: YesVital signs reviewed: YesRespiratory function stable:YesAirway is patent: YesCardiovascular function and hydration status stable: YesPain control satisfactory: YesNausea and vomiting control satisfactory:Yes

## 2019-07-07 ENCOUNTER — Telehealth: Admit: 2019-07-07 | Payer: PRIVATE HEALTH INSURANCE | Attending: Gastroenterology

## 2019-07-07 NOTE — Telephone Encounter
Reason for calling: I left a VM @2 :42 pm regarding scheduling tele health visit, instructed patient to call office to set one up.Please call with any questions,Laritza Vokes WigginsSchedulingAdvanced Endoscopy & Pancreatic DiseasesPhone- [phone]- [phone] 8, 2021  2:42 PM

## 2019-07-09 ENCOUNTER — Ambulatory Visit: Admit: 2019-07-09 | Payer: PRIVATE HEALTH INSURANCE | Attending: Family

## 2019-07-09 DIAGNOSIS — K852 Alcohol induced acute pancreatitis without necrosis or infection: Secondary | ICD-10-CM

## 2019-07-09 NOTE — Progress Notes
VIDEO TELEHEALTH VISIT: This clinician is part of the telehealth program and is conducting this visit in a currently approved location. For this visit the clinician and patient were present via interactive audio & video telecommunications system that permits real-time communications.Patient/parent or guardian consent given for video visit: Yes State patient is located in: [place] clinician is appropriately licensed in the above state to provide care for this visit. Other individuals present during the telehealth encounter and their role/relation: noneIf billing based on time, please complete (Not required if billing based on MDM):                           Total time spent in medical video consultation: ; Total time spent by the provider on the day of service, which includes time spent on chart review, medical video consultation, education, coordination of care/services and counseling Because this visit was completed over video, a hands-on physical exam was not performed.  Patient/parent or guardian understands and knows to call back if condition changes.[facility]Phone: [phone]: [phone]:   ICD-10-CM  1. Alcohol-induced acute pancreatitis without infection or necrosis  K85.20 MRI Abdomen w wo IV Contrast MRCP 2. Pseudocyst of pancreas  K86.3   HPI: Natasha English is a 44 y.o. female being interviewed today due to 1. Alcohol-induced acute pancreatitis without infection or necrosis  MRI Abdomen w wo IV Contrast MRCP 2. Pseudocyst of pancreas    Pt with history of pancreatitis and now with fluid collection in pancreas.  She saw Dr Mauro Kaufmann for EUS 2/21.  She is feeling well post procedure.  No abdominal pain, no nausea/vomiting, no loose stools, no unexplained weight loss.Medical History: PMH PSH Past Medical History: Diagnosis Date ? Encounter for blood transfusion   during 2nd pregnancy ? Essential hypertension 10/12/2009 ? High cholesterol  ? History of abnormal cervical Papanicolaou smear  ? Pancreatic cyst 07/2018 ? Pancreatitis  ? Paroxysmal ventricular tachycardia (HC Code) 10/12/2009  RT OUTFLOW TRACT/ ablations x 6 ? Pulmonary embolism (HC Code) 2008  no blood thinners at this time ? Seizures (HC Code)   during pregnancy  Past Surgical History: Procedure Laterality Date ? CARDIAC ELECTROPHYSIOLOGY MAPPING AND ABLATION  04/23/2008  [place] ? CERVICAL CONE BIOPSY   ? CESAREAN SECTION   ? MIBI Exercise stress/test (NM)  10/08/2007  L&M ? MOUTH SURGERY   ? TONSILLECTOMY   ? TUBAL LIGATION   ? UPPER GASTROINTESTINAL ENDOSCOPY    Social History Family History Social History Tobacco Use ? Smoking status: Current Some Day Smoker   Packs/day: 0.50   Types: Cigarettes ? Smokeless tobacco: Never Used Substance Use Topics ? Alcohol use: Not Currently   Comment: daily 2-3 drinks/day  Family History Problem Relation Age of Onset ? Heart disease Father       Myocardial Infarction ? No Known Problems Mother  ? Breast cancer Maternal Grandmother   Medications Current Outpatient Medications (CARDIAC DRUGS) Medication Sig ? amLODIPine Take 1 tablet (10 mg total) by mouth daily. Current Outpatient Medications (CARDIOVASCULAR) Medication Sig ? atorvastatin Take 20 mg by mouth daily.. Current Outpatient Medications (CNS DRUGS) Medication Sig ? lamoTRIgine Take 100 mg by mouth daily. With 50 mg total dose = 150 mg daily Current Outpatient Medications (PSYCHOTHERAPEUTIC DRUGS) Medication Sig ? buPROPion SR Take 300 mg by mouth daily.  Current Outpatient Medications (GASTROINTESTINAL) Medication Sig ? ondansetron Place 4 mg onto the tongue every 8 (eight) hours as needed for nausea. ?  pantoprazole Take 1 tablet (40 mg total) by mouth every 12 (twelve) hours. (Patient taking differently: Take 40 mg by mouth daily. ) ? Vascepa TAKE 2 TABLETS BY MOUTH TWICE A DAY Current Outpatient Medications (PRE-NATAL VITAMINS) Medication Sig ? Prenatal Vitamin Plus Low Iron Take 1 tablet by mouth daily. *OTC   Allergies Allergies Allergen Reactions ? Lisinopril Swelling   Angioedema on May 11, 2018 ? Hydrochlorothiazide Other (See Comments)   Electrolyte imbalance  Family History: non-contributoryReview of Systems: ROS:   No SOB or CPNo fever, weight lossNo abdominal pain, diarrhea or constipationNo rashNo arthralgias or myalgiasNo behavioral issuesNo headache or weaknessObjective: Limited physical exam was performed due to telehealth visit. There were no vitals filed for this visit.  Physical Exam:Gen: pleasant in NADRespirations: normal effortNeuro: AxOx3 Skin shows no signs of jaundiceLabs:Lipase Date Value Ref Range Status 05/17/2019 99 73 - 393 U/L Final Total Bilirubin Date Value Ref Range Status 05/17/2019 0.3 <1.0 mg/dL Final   Comment:   Use of this assay is not recommended for patients undergoing treatment with Eltrombopag due to the potential for falsely elevated results.  05/16/2019 0.2 <1.0 mg/dL Final   Comment:   Use of this assay is not recommended for patients undergoing treatment with Eltrombopag due to the potential for falsely elevated results.  04/17/2019 0.3 <1.0 mg/dL Final   Comment:   Use of this assay is not recommended for patients undergoing treatment with Eltrombopag due to the potential for falsely elevated results.  04/15/2019 0.3 <1.0 mg/dL Final   Comment:   Use of this assay is not recommended for patients undergoing treatment with Eltrombopag due to the potential for falsely elevated results.  Bilirubin, Direct Date Value Ref Range Status 05/06/2018 0.09 0.00 - 0.20 mg/dL Final 16/01/9603 5.40 9.81 - 0.20 mg/dL Final 19/14/7829 5.62 1.30 - 0.20 mg/dL Final 86/57/8469 6.29 5.28 - 0.20 mg/dL Final Aspartate Aminotransferase (AST) Date Value Ref Range Status 05/17/2019 15 15 - 37 U/L Final 05/16/2019 9 (L) 15 - 37 U/L Final 04/17/2019 12 (L) 15 - 37 U/L Final 04/15/2019 11 (L) 15 - 37 U/L Final Alanine Aminotransferase (ALT) Date Value Ref Range Status 05/17/2019 18 13 - 56 U/L Final 05/16/2019 14 13 - 56 U/L Final 04/17/2019 17 13 - 56 U/L Final 04/15/2019 23 13 - 56 U/L Final Alkaline Phosphatase Date Value Ref Range Status 05/17/2019 80 45 - 117 U/L Final 05/16/2019 87 45 - 117 U/L Final 04/17/2019 72 45 - 117 U/L Final 04/15/2019 76 45 - 117 U/L Final GGT Date Value Ref Range Status 08/03/2018 40 5 - 55 U/L Final 01/07/2018 396 (H) 5 - 55 U/L Final Albumin Date Value Ref Range Status 05/17/2019 3.8 3.4 - 5.0 g/dL Final 41/32/4401 4.3 3.4 - 5.0 g/dL Final 02/72/5366 3.7 3.4 - 5.0 g/dL Final 44/06/4740 4.4 3.4 - 5.0 g/dL Final  No results found for: IGGResults for orders placed or performed during the hospital encounter of 06/27/19 Upper EUS Result Value Ref Range  Upper Endoscopic Ultrasound     [facility]_______________________________________________________________________________Patient Name: Natasha English           MRN: [id number] Procedure Date: 06/27/2019 12:28 PM    Date of Birth: 12/21/1977Age: 81                               Admit Type: OutpatientGender: Female  CSN #: [id number] Note Status: Finalized                Attending MD: Almyra Free , MD_______________________________________________________________________________ Procedure:           Upper EUSIndications:         Pancreatic cyst on MRCPProviders:           Priya A. Mauro Kaufmann, MD (Doctor)Referring MD:        Hezzie Bump. Verdis Prime (Referring MD)Medicines:           Monitored Anesthesia CareComplications:       No immediate complications. Estimated blood loss: None._______________________________________________________________________________Requesting Provider: Procedure:           After obtaining informed consent, the endoscope was                       passed under direct vision. Throughout the procedure,                      the patient's blood pressure, pulse, and oxygen                      saturations were monitored continuously. The [id number]                      [id number] was introduced through the mouth, and advanced                      to the second part of duodenum. The [id number]                      was introduced through the mouth, and advanced to the                      second part of duodenum.                                                                               Findings:     ENDOSCOPIC FINDING: :     The examined esophagus was endoscopically normal.     The entire examined stomach was endoscopically normal.     The examined duodenum was endoscopically normal.     ENDOSONOGRAPHIC FINDING: :     A hypoechoic lesion suggestive of a cyst was identified in the      pancreatic head. It does not communicate with the pancreatic duct. The      lesion measure d 20 mm in maximal cross-sectional diameter. There was a      single compartment without septae. There was internal debris within the      fluid-filled cavity.     The pancreatic duct had a dilated endosonographic appearance in the body      of the pancreas. The pancreatic duct measured up to 7 mm in diameter.      The duct appeared to be squeezed by the cyst in th ehead region     The pancreatic duct had a dilated endosonographic appearance in the genu      of the pancreas.     The pancreatic duct had  a dilated endosonographic appearance in the tail      of the pancreas.     There was no sign of significant endosonographic abnormality in the      common bile duct. Impression:          - Normal esophagus.                     - Normal stomach.                     - Normal examined duodenum.                     - A cystic lesion was seen in the pancreatic head. The                       diagnosis is a pancreatic pseudocyst.                     - The pancreatic duct had a dilated endosonographic                      appearance in the body of the pancreas. The pancreatic                      duct measured up to 7 mm in diameter.                     - The pancreatic duct had a dilated endosonographic                      appearance in the genu of the pancreas.                     - The pancreatic duct had a dilated endosonographic                      appearance in the tail of the pancreas.                     - No specimens collected.Recommendation:      - Perform magnetic resonance imaging (MRI) with                      gadolinium in 2 months. Consider ERCP /EUS if dyct is                      still dilated                                                                               Deveron Furlong, MD___________________Priya Karalee Height, MD2/26/2021 1:50:29 PMThis report has been signed electronically.Number of Addenda: 0 Note Initiated On: 06/27/2019 12:28 PMEstimated Blood Loss:     Estimated blood loss: none.Scope VF:IEPPI Out: Imaging:NoneAssessment: Natasha English is a 44 y.o. female being interviewed today for 1. Alcohol-induced acute pancreatitis without infection or necrosis  MRI Abdomen w wo IV Contrast MRCP 2. Pseudocyst of pancreas   3. Dilated PDPlan:   ICD-10-CM  1. Alcohol-induced  acute pancreatitis without infection or necrosis  K85.20 MRI Abdomen w wo IV Contrast MRCP 2. Pseudocyst of pancreas  K86.3  3. Dilated PD-Recommendations: Pt with likely pseudocyst s/p pancreatitis as well as dilated PD.  Unclear if this is resolving inflammation or a more concerning issue.  We will get an MRI/MRCP w/wo contrast in 2 months to recheck the cyst for reabsorption and the dilation for improvement.  If it persists, may recommend repeat EUS +/-ERCP.  Risks of the procedure were reviewed with the pt today including but not limited to mild/moderate/severe pancreatitis, bleeding, infection, perforation, reaction to sedation, missed lesions, failed procedure and remote possibility of death.  The risks discussed could lead to pt needing blood transfusions or surgery.  Pt demonstrated good understanding of this and was able to ask any questions.  Pt agreeable to plan, all questions answered.*I will ask our scheduling team to set up the MRI/MRCP abdomen w/wo contrast in late April/may 2021 with a telehealth visit to follow to review the results and determine the plan.Thank you for the kind referral.  We greatly appreciate the opportunity to assist in your patient's care.  If there are further questions, please do not hesitate to contact us.I spent 15  minutes in contact with the patient, of which at least half was spent in counseling and coordination of care. Best Regards,Electronically Signed by Camaron Cammack [place] Loman Logan, APRN, July 09, 2019 1:52 PMHillary Finnley [place] APRN MSN/MPHNurse Practitioner Clinical CoordinatorAdvanced Endoscopy & Pancreatic DiseasesDigestive [facility] School of MedicineTel: [phone] Fax: [phone]

## 2019-07-10 DIAGNOSIS — K863 Pseudocyst of pancreas: Secondary | ICD-10-CM

## 2019-07-11 ENCOUNTER — Telehealth: Admit: 2019-07-11 | Payer: PRIVATE HEALTH INSURANCE | Attending: Family

## 2019-07-11 NOTE — Telephone Encounter
Pt called reporting that she had 6/10 band of pain around upper abdomen.  No nausea currently but was nauseated earlier today.  She had lipase drawn at work and reported it as 8500.  I recommended if her pain is that bad, she needs to go to ED or if she cannot keep fluids down, she also needs to go to ED.  Pt is agreeable to plan, all questions answered.

## 2019-07-31 ENCOUNTER — Encounter: Admit: 2019-07-31 | Payer: PRIVATE HEALTH INSURANCE

## 2019-07-31 ENCOUNTER — Inpatient Hospital Stay
Admit: 2019-07-31 | Discharge: 2019-08-01 | Payer: BLUE CROSS/BLUE SHIELD | Source: Home / Self Care | Admitting: Hospitalist

## 2019-07-31 ENCOUNTER — Inpatient Hospital Stay: Admit: 2019-07-31 | Payer: PRIVATE HEALTH INSURANCE

## 2019-07-31 DIAGNOSIS — R42 Dizziness and giddiness: Secondary | ICD-10-CM

## 2019-07-31 DIAGNOSIS — I2699 Other pulmonary embolism without acute cor pulmonale: Secondary | ICD-10-CM

## 2019-07-31 DIAGNOSIS — K862 Cyst of pancreas: Secondary | ICD-10-CM

## 2019-07-31 DIAGNOSIS — Z5189 Encounter for other specified aftercare: Secondary | ICD-10-CM

## 2019-07-31 DIAGNOSIS — R569 Unspecified convulsions: Secondary | ICD-10-CM

## 2019-07-31 DIAGNOSIS — Z8742 Personal history of other diseases of the female genital tract: Secondary | ICD-10-CM

## 2019-07-31 DIAGNOSIS — I10 Essential (primary) hypertension: Secondary | ICD-10-CM

## 2019-07-31 DIAGNOSIS — K859 Acute pancreatitis without necrosis or infection, unspecified: Secondary | ICD-10-CM

## 2019-07-31 DIAGNOSIS — I4729 Paroxysmal ventricular tachycardia (HC Code): Secondary | ICD-10-CM

## 2019-07-31 DIAGNOSIS — E7800 Pure hypercholesterolemia, unspecified: Secondary | ICD-10-CM

## 2019-07-31 LAB — CBC WITH AUTO DIFFERENTIAL
BKR WAM ABSOLUTE IMMATURE GRANULOCYTES.: 0.03 x 1000/ÂµL (ref 0.00–0.10)
BKR WAM ABSOLUTE LYMPHOCYTE COUNT.: 3.32 x 1000/ÂµL (ref 1.00–4.50)
BKR WAM ABSOLUTE NEUTROPHIL COUNT.: 7.25 x 1000/ÂµL — ABNORMAL HIGH (ref 1.40–6.60)
BKR WAM BASOPHIL ABSOLUTE COUNT.: 0.09 x 1000/ÂµL (ref 0.0–0.3)
BKR WAM BASOPHILS: 0.8 % (ref 0.0–3.0)
BKR WAM EOSINOPHIL ABSOLUTE COUNT.: 0.22 x 1000/ÂµL (ref 0.00–0.45)
BKR WAM EOSINOPHILS: 1.9 % (ref 0.0–4.0)
BKR WAM HEMATOCRIT: 40.6 % (ref 34.0–45.0)
BKR WAM HEMOGLOBIN: 13.4 g/dL (ref 11.0–15.0)
BKR WAM IMMATURE GRANULOCYTES: 0.3 % (ref 0.0–1.0)
BKR WAM LYMPHOCYTES: 29 % (ref 25.0–45.0)
BKR WAM MCH PG: 29.9 pg (ref 27–33)
BKR WAM MCHC: 33 g/dL (ref 32.0–36.0)
BKR WAM MCV: 90.6 fL (ref 79.0–99.0)
BKR WAM MONOCYTE ABSOLUTE COUNT.: 0.52 x 1000/ÂµL (ref 0.00–1.20)
BKR WAM MONOCYTES: 4.5 % (ref 0.0–12.0)
BKR WAM MPV: 8.8 fL (ref 7.5–11.5)
BKR WAM NEUTROPHILS: 63.5 % (ref 36.0–66.0)
BKR WAM NUCLEATED RED BLOOD CELLS: 0 % (ref 0.0–5.0)
BKR WAM PLATELETS: 363 x1000/ÂµL (ref 140–400)
BKR WAM RDW-CV: 14 % (ref 11.5–14.5)
BKR WAM RED BLOOD CELL COUNT.: 4.48 M/ÂµL (ref 4.00–5.20)
BKR WAM WHITE BLOOD CELL COUNT.: 11.43 x1000/ÂµL — ABNORMAL HIGH (ref 4.00–10.00)

## 2019-07-31 LAB — LIPASE: BKR LIPASE: 1452 U/L — ABNORMAL HIGH (ref 73–393)

## 2019-07-31 LAB — COMPREHENSIVE METABOLIC PANEL
BKR ALANINE AMINOTRANSFERASE (ALT): 20 U/L (ref 13–56)
BKR ALBUMIN: 4.4 g/dL (ref 3.4–5.0)
BKR ALKALINE PHOSPHATASE: 96 U/L (ref 45–117)
BKR ANION GAP (LM): 8 mmol/L (ref 5–15)
BKR ASPARTATE AMINOTRANSFERASE (AST): 9 U/L — ABNORMAL LOW (ref 15–37)
BKR BILIRUBIN TOTAL: 0.3 mg/dL (ref <1.0)
BKR BLOOD UREA NITROGEN: 11 mg/dL (ref 7–18)
BKR CALCIUM: 9.9 mg/dL (ref 8.5–10.1)
BKR CHLORIDE: 107 mmol/L (ref 98–107)
BKR CO2: 22 mmol/L (ref 21–32)
BKR CREATININE: 0.67 mg/dL (ref 0.55–1.02)
BKR EGFR (AFR AMER) (LMC): >60 mL/min/{1.73_m2} (ref >60)
BKR EGFR (NON AFR AMER) (LMC): >60 mL/min/{1.73_m2} (ref >60)
BKR GLOBULIN: 3.7 g/dL (ref 2.5–5.0)
BKR GLUCOSE: 197 mg/dL — ABNORMAL HIGH (ref 65–110)
BKR POTASSIUM: 3.8 mmol/L (ref 3.5–5.1)
BKR PROTEIN TOTAL: 8.1 g/dL (ref 6.4–8.2)
BKR SODIUM: 137 mmol/L (ref 136–145)

## 2019-07-31 LAB — LIPID PANEL
BKR CHOLESTEROL: 153 mg/dL
BKR HDL CHOLESTEROL: 44 mg/dL
BKR LDL CHOLESTEROL CALCULATED: 60 mg/dL
BKR TRIGLYCERIDES: 245 mg/dL — ABNORMAL HIGH

## 2019-07-31 LAB — DRUGS OF ABUSE, URINE     (WH)
BKR AMPHETAMINE SCREEN, URINE: NEGATIVE
BKR BARBITURATE SCREEN, URINE: NEGATIVE
BKR BENZODIAZEPINE SCREEN, URINE: POSITIVE — AB
BKR CANNABINOID SCREEN, URINE: POSITIVE — AB
BKR COCAINE SCREEN, URINE: NEGATIVE
BKR METHADONE SCREEN, URINE: NEGATIVE
BKR OPIATE SCREEN, URINE: NEGATIVE

## 2019-07-31 LAB — C-REACTIVE PROTEIN     (CRP): BKR C-REACTIVE PROTEIN (LM): 1.06 mg/dL — ABNORMAL HIGH (ref <0.30)

## 2019-07-31 LAB — HCG, SERUM, QUALITATIVE (BH GH L LMW): BKR SERUM PREGNANCY-QUALITATIVE: NEGATIVE

## 2019-07-31 LAB — SARS COV-2 (COVID-19) RNA: BKR SARS-COV-2 RNA (COVID-19) (YH): NEGATIVE

## 2019-07-31 LAB — ETHANOL     (BH GH L LMW YH): BKR ETHANOL (LM WH): 0.02 g/dL — ABNORMAL HIGH (ref <0.01)

## 2019-07-31 MED ORDER — LAMOTRIGINE IMMEDIATE RELEASE 100 MG TABLET
100 mg | Freq: Every day | ORAL | Status: DC
Start: 2019-07-31 — End: 2019-07-31

## 2019-07-31 MED ORDER — SODIUM CHLORIDE 0.9 % BOLUS (NEW BAG)
0.9 % | INTRAVENOUS | Status: CP
Start: 2019-07-31 — End: ?
  Administered 2019-07-31: 10:00:00 0.9 mL/h via INTRAVENOUS

## 2019-07-31 MED ORDER — LORAZEPAM 2 MG/ML INJECTION SOLUTION
2 mg/mL | Freq: Once | INTRAVENOUS | Status: CP
Start: 2019-07-31 — End: ?
  Administered 2019-07-31: 11:00:00 2 mL via INTRAVENOUS

## 2019-07-31 MED ORDER — POLYETHYLENE GLYCOL 3350 17 GRAM ORAL POWDER PACKET
17 gram | Freq: Every day | ORAL | Status: DC | PRN
Start: 2019-07-31 — End: 2019-08-01

## 2019-07-31 MED ORDER — AMLODIPINE 10 MG TABLET
10 mg | Freq: Every day | ORAL | Status: DC
Start: 2019-07-31 — End: 2019-08-01

## 2019-07-31 MED ORDER — DIPHENHYDRAMINE 50 MG/ML INJECTION SOLUTION
50 mg/mL | Freq: Once | INTRAVENOUS | Status: CP
Start: 2019-07-31 — End: ?
  Administered 2019-07-31: 12:00:00 50 mL via INTRAVENOUS

## 2019-07-31 MED ORDER — LACTATED RINGERS INTRAVENOUS SOLUTION
INTRAVENOUS | Status: DC
Start: 2019-07-31 — End: 2019-08-01
  Administered 2019-07-31 – 2019-08-01 (×4): 1000.000 mL/h via INTRAVENOUS

## 2019-07-31 MED ORDER — ONDANSETRON 4 MG DISINTEGRATING TABLET
4 mg | Freq: Four times a day (QID) | ORAL | Status: DC | PRN
Start: 2019-07-31 — End: 2019-08-01

## 2019-07-31 MED ORDER — PANTOPRAZOLE 40 MG TABLET,DELAYED RELEASE
40 mg | Freq: Two times a day (BID) | ORAL | Status: DC
Start: 2019-07-31 — End: 2019-08-01
  Administered 2019-07-31 – 2019-08-01 (×3): 40 mg via ORAL

## 2019-07-31 MED ORDER — BUPROPION HCL XL 150 MG 24 HR TABLET, EXTENDED RELEASE
150 mg | Freq: Every day | ORAL | Status: DC
Start: 2019-07-31 — End: 2019-08-01
  Administered 2019-08-01: 12:00:00 150 mg via ORAL

## 2019-07-31 MED ORDER — LAMOTRIGINE IMMEDIATE RELEASE 100 MG TABLET
100 mg | Freq: Every day | ORAL | Status: DC
Start: 2019-07-31 — End: 2019-08-01
  Administered 2019-08-01: 12:00:00 100 mg via ORAL

## 2019-07-31 MED ORDER — MECLIZINE 25 MG TABLET
25 mg | Freq: Once | ORAL | Status: CP
Start: 2019-07-31 — End: ?
  Administered 2019-07-31: 10:00:00 25 mg via ORAL

## 2019-07-31 MED ORDER — ROSUVASTATIN 10 MG TABLET
10 mg | Freq: Every day | ORAL | 1 refills | Status: DC
Start: 2019-07-31 — End: 2019-08-01
  Administered 2019-08-01: 12:00:00 10 mg via ORAL

## 2019-07-31 MED ORDER — SODIUM CHLORIDE 0.9 % (FLUSH) INJECTION SYRINGE
0.9 % | Freq: Three times a day (TID) | INTRAVENOUS | Status: DC
Start: 2019-07-31 — End: 2019-08-01
  Administered 2019-08-01: 02:00:00 0.9 mL via INTRAVENOUS

## 2019-07-31 MED ORDER — LACTATED RINGERS IV BOLUS (NEW BAG)
Freq: Once | INTRAVENOUS | Status: CP
Start: 2019-07-31 — End: ?
  Administered 2019-07-31: 11:00:00 1000.000 mL/h via INTRAVENOUS

## 2019-07-31 MED ORDER — MORPHINE 2 MG/ML INJECTION SYRINGE
2 mg/mL | SUBCUTANEOUS | Status: CP | PRN
Start: 2019-07-31 — End: ?
  Administered 2019-07-31 – 2019-08-01 (×5): 2 mL via SUBCUTANEOUS

## 2019-07-31 MED ORDER — ENOXAPARIN 40 MG/0.4 ML SUBCUTANEOUS SYRINGE
400.4 mg/0.4 mL | SUBCUTANEOUS | Status: DC
Start: 2019-07-31 — End: 2019-08-01
  Administered 2019-08-01: 12:00:00 40 mg/0.4 mL via SUBCUTANEOUS

## 2019-07-31 MED ORDER — NICOTINE 14 MG/24 HR DAILY TRANSDERMAL PATCH
14 mg | Freq: Every day | TRANSDERMAL | Status: DC
Start: 2019-07-31 — End: 2019-08-01

## 2019-07-31 MED ORDER — SODIUM CHLORIDE 0.9 % (FLUSH) INJECTION SYRINGE
0.9 % | INTRAVENOUS | Status: DC | PRN
Start: 2019-07-31 — End: 2019-08-01

## 2019-07-31 MED ORDER — ONDANSETRON HCL (PF) 4 MG/2 ML INJECTION SOLUTION
42 mg/2 mL | Freq: Four times a day (QID) | INTRAVENOUS | Status: DC | PRN
Start: 2019-07-31 — End: 2019-08-01
  Administered 2019-07-31: 18:00:00 4 mL via INTRAVENOUS

## 2019-07-31 NOTE — ED Notes
8:34 AM Floor Handoff Telemetry: 	[]  Yes		[x]  NoCode Status:   [x]  Full		[]  DNR		[]  DNI		Other (specify):Safety Precautions: [x]  None	[]  Sitter   []  Restraints	[]  Suicidal	[]  Fall Risk	Other (specify):Mentation/Orientation:	 A&O (Self, person, place, time) x       4   	 Disoriented to:                    	 Deficits: []  Hearing impaired	[]  Blind  []  Nonverbal	 []  Mental retardationOxygenation Upon Admission: [x]  RA	[]  NC	[]  Venti  []  Simple Mask []  Other	Baseline O2 Status? [x]  Yes	[]  NoAmbulation: [x]  Independent	[]  Cane   []  Walker	[]  Wheelchair	[]  Bedbound		[]  Hemiplegic	[]  Paraplegic	[]  QuadraplegicEliminiation: [x]  Independent	[]  Commode	[]  Bedpan/Urinal  []  Straight Cath []  Foley cath			[]  Urostomy	[]  Colostomy	Other (specify):Diarrhea/Loose stool : []  1x within 24h  []  2x within 24h  []  3x within 24h  [x]  None 	C.Diff Order: 	[]  Ordered- needs to be collected             []  Collected-sent to lab             []  Resulted - Negative C.Diff             []  Resulted - Positive C.Diff[]  Not Ordered   []  N/ASkin Alteration: []  Pressure Injury []  Wound [x]  None []  Skin not assessedDiet: [x]  Regular/No order placed	[]  NPO		Other (specify):IV Access: [x]  PIV   []  PICC    []  Port    [] Central line    []  A-line    Other (specify)IVF/GTT Running Upon ED Departure? []  No	    [x]  Yes (specify): LR @ 125Outstanding Meds/Treatments/Tests:Patient Belongings: clothing, purse, phoneAre the belongings documented?          []  No	    [x]  YesIs someone taking belongings home?   [x]  No     []  Yes  Who? (specify)                                   ED RN and Contact number/MHB #:          Lawson Fiscal [phone]

## 2019-07-31 NOTE — Other
[facility]	Gastroenterology Consult Note Consult Information: Consultation requested by: Johny Shock, MDReason for consultation: Pancreatitis, pancreatic pseudocystSource of Information: Patient and EMR/Previous RecordPresentation History: 44 y/o F, PMH recurrent pancreatitis with pseudocyst, alcohol abuse (sober since 03/2018),?hypertriglyceridemia/dyslipidemia, Bipolar disorder, HTN, HLD, paroxysmal VT who presented with vertigo. She reports yesterday she had significant fatigue and slept most of the day, and had mild dizziness. Today woke up with significant dizziness, double vision. Nausea with 2 episodes of emesis. She reports two days ago she started to develop epigastric discomfort. WBC 11, Lipase 1400, triglycerides 245. COVID negative, has been fully vaccinated. Heartburn is helped by PPI BID. She notes 20 lb weight loss in past 4-5 months, her PCP had previously started her on Wellbutrin for this. Smokes marijuana twice daily. Denies recent new medications, alcohol use, CP, SOB. She does report a couple weeks ago she saw her PCP for a significant amount of abnormal vaginal bleeding.  CXray unremarkable. [place] head without any acute findings. Review of Medical History/Allergies/Family History/Social History/Medications: Gastrointestinal History: Follow[facility]. She has had three admission for pancreatitis (07/2018, 04/2019, 05/2019). Imaging revealed cystic lesion in pancreatic head with pancreatic ductal dilation. Pseudocyst appeared to be enlarging on repeat imaging. She had an EUS at [facility] with Dr. Mauro Kaufmann 06/27/19 which revealed pancreatic pseudocyst in head of pancreas with dilated pancreatic duct. Plan is for repeat MRI in May, 2021 and if ductal dilation remains EUS +/- ERCP will be considered.  Past Medical/Surgical History:Past Medical History: Diagnosis Date ? Encounter for blood transfusion   during 2nd pregnancy ? Essential hypertension 10/12/2009 ? High cholesterol  ? History of abnormal cervical Papanicolaou smear  ? Pancreatic cyst 07/2018 ? Pancreatitis  ? Paroxysmal ventricular tachycardia (HC Code) 10/12/2009  RT OUTFLOW TRACT/ ablations x 6 ? Pulmonary embolism (HC Code) 2008  no blood thinners at this time ? Seizures (HC Code)   during pregnancy Past Surgical History: Procedure Laterality Date ? CARDIAC ELECTROPHYSIOLOGY MAPPING AND ABLATION  04/23/2008  [place] ? CERVICAL CONE BIOPSY   ? CESAREAN SECTION   ? MIBI Exercise stress/test (NM)  10/08/2007  [facility] ? MOUTH SURGERY   ? TONSILLECTOMY   ? TUBAL LIGATION   ? UPPER GASTROINTESTINAL ENDOSCOPY    Allergies:Lisinopril and HydrochlorothiazideFamily History: Denies FH colon/gastric/pancreatic/liver cancer.?Social History: Prior history of alcohol abuse. Marijuana use. Home Medications:Marland Kitchen  Medication Sig Taking? amLODIPine (NORVASC) 10 mg tablet Take 1 tablet (10 mg total) by mouth daily. Yes atorvastatin (LIPITOR) 20 MG tablet Take 20 mg by mouth daily.. Yes buPROPion SR (WELLBUTRIN SR) 150 mg 12 hr tablet Take 300 mg by mouth daily.  Yes lamoTRIgine (LAMICTAL) 200 mg immediate release tablet Take 200 mg by mouth daily.  Yes ondansetron (ZOFRAN-ODT) 4 mg disintegrating tablet Place 4 mg onto the tongue every 8 (eight) hours as needed for nausea. Yes pantoprazole (PROTONIX) 40 mg tablet Take 1 tablet (40 mg total) by mouth every 12 (twelve) hours.Patient taking differently: Take 40 mg by mouth daily.  Yes PRENATAL VITAMIN PLUS LOW IRON 27 mg iron- 1 mg Tab tablet Take 1 tablet by mouth daily. *OTC Yes VASCEPA 1 gram capsule TAKE 2 TABLETS BY MOUTH TWICE A DAYPatient taking differently: Take 2 g by mouth 2 (two) times daily.  Yes Inpatient Medications:Current Facility-Administered Medications Medication Dose Route Frequency Provider Last Rate Last Admin ? [Held by provider] amLODIPine (NORVASC) tablet 10 mg  10 mg Oral Daily Johny Shock, MD     ? Melene Muller ON 08/01/2019]  buPROPion XL (WELLBUTRIN XL) 24 hr tablet 300 mg  300 mg Oral Daily Johny Shock, MD     ? Melene Muller ON 08/01/2019] enoxaparin (LOVENOX) syringe 40 mg  40 mg Subcutaneous Q24H Johny Shock, MD     ? lamoTRIgine (laMICtal) Immediate Release tablet 200 mg  200 mg Oral Daily Johny Shock, MD     ? pantoprazole (PROTONIX) EC tablet 40 mg  40 mg Oral Q12H Johny Shock, MD     ? Melene Muller ON 08/01/2019] rosuvastatin (CRESTOR) tablet 10 mg  10 mg Oral Daily Johny Shock, MD     ? sodium chloride 0.9 % flush 3 mL  3 mL IV Push Q8H Johny Shock, MD     Review of Systems: A 14 point review of systems was completed, and pertinent review of systems per HPI.Review of SystemsPhysical Exam: Vitals:Vitals:  07/31/19 1114 BP: (!) 100/50 Pulse:  Resp:  Temp:  Physical Exam Constitutional: No distress. HENT: Head: Normocephalic and atraumatic. Eyes: Right eye exhibits no discharge. Left eye exhibits no discharge. No scleral icterus. Cardiovascular: Normal rate. Pulmonary/Chest: Effort normal. No respiratory distress. She has no wheezes. She has no rales. Abdominal: Soft. She exhibits no distension. There is abdominal tenderness (RUQ, midline epigastric, periumbilical). There is no rebound. Skin: She is not diaphoretic. Review of Labs/Diagnostics: Lab Review:Recent Labs Lab 04/01/210540 WBC 11.43* HGB 13.4 HCT 40.6 PLT 363 MCV 90.6 Recent Labs Lab 04/01/210539 NA 137 K 3.8 CL 107 CO2 22 BUN 11 CREATININE 0.67 No results for input(s): INR, PROTIME, AMMONIA in the last 168 hours.Recent Labs Lab 04/01/210539 PROT 8.1 ALBUMIN 4.4 BILITOT 0.3 AST 9* ALT 20 ALKPHOS 96 LIPASE 1,452* Diagnostic Review: I have reviewed the patient's Radiology report(s) within the last 48 hours. Significant findings include:  No results found. Impression: 44 y/o F, PMH recurrent pancreatitis with pseudocyst, alcohol abuse (sober since 03/2018),?hypertriglyceridemia/dyslipidemia, Bipolar disorder, HTN, HLD, paroxysmal VT who presented with vertigo and double vision, significant fatigue. two days ago she started to develop epigastric discomfort. WBC 11, Lipase 1400, triglycerides 245. COVID negative, has been fully vaccinated. History of recurrent pancreatitis with pancreatic head pseudocyst and dilated pancreatic duct confirmed on EUS 06/27/19. Plan is for repeat MRI in May, 2021 and if ductal dilation remains EUS +/- ERCP will be considered.  I have reviewed the patient's problem list and updated it as needed.Recommendations: 1. Recurrent pancreatitis in setting of known pseudocyst. Increased LR to 200 ml/hr. Will give low fat diet and monitor PO intake. Pain meds and antiemetics as needed. Will discuss with Dr. Orvan Falconer if any further imaging needed. 2. Workup of vertigo, diplopia per primary team. This consultation was seen with Dr. Orvan Falconer, and further recommendations are pending his evaluation. This consultation was appreciated. Signed: Domingo Dimes, PA-C4/1/202112:44 PMI interviewed examined the patient with Domingo Dimes, PA and agree with her evaluation and link my note to hers72 year old woman with acute recurrent pancreatitis who has a pseudocyst.  She recently underwent endoscopic ultrasound.  Planning on follow-up with an MRI.  The patient denies drinking alcohol but her alcohol level is positive at 0.02.  She presents to the hospital because of diplopia and vertigo.Alert orientedLungs clear to auscultationRegular rate and rhythmAbdomen with mild right upper quadrant tenderness64 year old woman with acute recurrent pancreatitis.  In the past she has had triglyceride its treated.  She has not had gallstones in the past.  She denies use of alcohol though her alcohol level 0.02 this admission.  She is planning on follow-up with MRI for her pseudocyst.  She presents to the hospital with diplopia and vertigo which is unrelated to her pancreatitis.  She feels that she is having a typical increase in her abdominal pain but not a full bone flare of pancreatitis.  Her lipase is elevated.  Will treat as if acute pancreatitis with fluids.  She may have a regular low-fat diet.  Workup of neurologic issues as per hospitalist.  Not planning on imaging the pancreas and pseudocyst at present, but would have low threshold if needed.Could consider Creon to treat pain from possible chronic pancreatitis.  Of note there is no exocrine pancreatic insufficiency as evident on a recent normal pancreatic elastase.Signed: Baldwin Crown, MD 4/1/20213:08 PM

## 2019-07-31 NOTE — H&P
Natasha English			General Medicine History & PhysicalHistory provided by: the patientHistory limited by: the condition of the patientPatient presents from: HomePatient Natasha English DOB [birth year]   CC: Dizziness (pt reports recent onset of dizziness and seeing double.  pt sts she feels off balance and needing to hold onto items to walk.  pt denies recent trauma, strenuous activity, alterations in food/fluid intake.  pt sts she was unable to sleep yesterday.  denies numbness/tingling, h/a.) HPI: HPI: Natasha English is a 44 y.o. female with history of recurrent pancreatitis ( first episode related to alcohol, also has high TG--> sober for more than a year), pregnancy related seizure/PE (currently not on [facility]) was not feeling well since yesterday.She felt tired, Nauseous and two episodes of vomiting yesterday afternoon. She skipped her breakfast/lunch, took a small bite of dinner. When she woke up this morning, she felt dizzy- difficult to describe but lightheaded/blurry -- which was worse when she stood  Up/moved. Still has nausea but no further vomiting. No hematemesis, diarrhea/rectal bleeding. No fever or chillsNo new focal weakness/numbness ( except on both fingers) Reports mild abdominal/epigastric discomfort ( has some baseline abdominal discomfort) Smokes marijuana.No new medications02/26 admitted to [facility] for recurrent pacreatitis/pancreatic cyst01/16/2021 discharged from [facility] English Natasha English is a 44 year old female with a history of alcohol abuse and chronic pancreatitis who was admitted with recurrent acute on chronic pancreatitis. ?She has improved very quickly. ?She would like to go home. ?The patient states that she has been sober from alcohol for approximately 1 year,?and that she is scheduled to follow-up with her primary gastroenterologist Dr.?Ouellette, 1/22?for evaluation for outpatient testing at [facility] regarding recurrent pancreatitis.  She is tolerating a low-fat diet and will be discharged home later today CR Social Hx: Reviewed Family Hx: Reviewed Home Meds: I have utilized all available immediate resources to obtain, update, or review the patient's current medications.Prior to Admission medications  Medication Sig Start Date End Date Taking? Authorizing Provider amLODIPine (NORVASC) 10 mg tablet Take 1 tablet (10 mg total) by mouth daily. 05/12/18   Richarda Overlie, APRN atorvastatin (LIPITOR) 20 MG tablet Take 20 mg by mouth daily.. 11/08/16   [provider] buPROPion SR [facility] English SR) 150 mg 12 hr tablet Take 300 mg by mouth daily.  04/26/19   [provider] lamoTRIgine (LAMICTAL) 100 mg immediate release tablet Take 100 mg by mouth daily. With 50 mg total dose = 150 mg daily 04/23/19   [provider] ondansetron (ZOFRAN-ODT) 4 mg disintegrating tablet Place 4 mg onto the tongue every 8 (eight) hours as needed for nausea.    [provider] pantoprazole (PROTONIX) 40 mg tablet Take 1 tablet (40 mg total) by mouth every 12 (twelve) hours.Patient taking differently: Take 40 mg by mouth daily.  05/08/18   Lorina Rabon, MD PRENATAL VITAMIN PLUS LOW IRON 27 mg iron- 1 mg Tab tablet Take 1 tablet by mouth daily. *OTC 11/26/17   [provider] VASCEPA 1 gram capsule TAKE 2 TABLETS BY MOUTH TWICE A DAY 08/01/18   Beckey Downing, MD  Allergies:  Lisinopril and Hydrochlorothiazide ROS Review of Systems: Review of Systems Constitutional, HEENT, cardiovascular, respiratory, GI, GU, musculoskeletal, neuro, and psych, or each evaluated and 14 point review of systems essentially negative  except for that noted in the above history of present illness.  PMH PSH Past Medical History: Diagnosis Date ? Encounter for blood transfusion   during 2nd pregnancy ? Essential hypertension 10/12/2009 ? High cholesterol  ? History  of abnormal cervical Papanicolaou smear  ? Pancreatic cyst 07/2018 ? Pancreatitis  ? Paroxysmal ventricular tachycardia (HC Code) 10/12/2009  RT OUTFLOW TRACT/ ablations x 6 ? Pulmonary embolism (HC Code) 2008  no blood thinners at this time ? Seizures (HC Code)   during pregnancy  Past Surgical History: Procedure Laterality Date ? CARDIAC ELECTROPHYSIOLOGY MAPPING AND ABLATION  04/23/2008  [place] ? CERVICAL CONE BIOPSY   ? CESAREAN SECTION   ? MIBI Exercise stress/test (NM)  10/08/2007  [facility] ? MOUTH SURGERY   ? TONSILLECTOMY   ? TUBAL LIGATION   ? UPPER GASTROINTESTINAL ENDOSCOPY    Social History Family History Social History Tobacco Use ? Smoking status: Current Some Day Smoker   Packs/day: 0.50   Types: Cigarettes ? Smokeless tobacco: Never Used Substance Use Topics ? Alcohol use: Not Currently   Comment: daily 2-3 drinks/day  Family History Problem Relation Age of Onset ? Heart disease Father       Myocardial Infarction ? No Known Problems Mother  ? Breast cancer Maternal Grandmother   .................................................................................................................................................................................................................................................................................................................................................................................................................................................................................Marland KitchenVitals:Last 24 hours: Temp:  [98.2 ?F (36.8 ?C)] 98.2 ?F (36.8 ?C)Pulse:  [113] 113Resp:  [20] 20BP: (90-136)/(66-92) 103/66SpO2:  [94 %-99 %] 94 %Temp (24hrs), Avg:98.2 ?F (36.8 ?C), Min:98.2 ?F (36.8 ?C), Max:98.2 ?F (36.8 ?C)Gross Totals (Last 24 hours) at 07/31/2019 0901Last data filed at 07/31/2019 0826Intake 2000 ml Output -- Net 2000 ml  Physical Examination: General Alert and awake Comfortable Neuro Moves all extremities, normal facial symmetryNo pronator driftNo neck stiffness HEENT AT,NC, EOMI, Normal sclera/conjuctiva.Neck supple, non tender.  Eye  No icterus, erythema Respiratory  Normal respiratory effort, clear to auscultation.  Extremity No extremity edema, pulses palpable, no cyanosis. Cardiovascular S1s2, no RG M Psychiatric  [x]  Mood OK, GI/GU Non distended abdomen, soft, BS+ Skin No rash, ulceration.  No calf tenderness.      Labs Hematology       - CBCRecent Labs Lab 04/01/210540 WBC 11.43* HGB 13.4 HCT 40.6 PLT 363  Hematology         - CoagsNo results for input(s): INR, PTT in the last 168 hours. Renal Recent Labs Lab 04/01/210539 NA 137 K 3.8 CL 107 CO2 22 BUN 11 CREATININE 0.67 CALCIUM 9.9  GI/GU       - LFTsRecent Labs Lab 04/01/210539 ALT 20 AST 9* ALKPHOS 96 BILITOT 0.3     - LIPASERecent Labs Lab 04/01/210539 LIPASE 1,452*       - No results for input(s): AMMONIA in the last 168 hours. Cardiovascular     - Cardiac EnzymesNo results for input(s): TROPONINI, CKTOTAL, CKMB in the last 168 hours.Invalid input(s): TROPONINPOC     - pBNPNo components found for: PROBNP Endocrine   - No results for input(s): TSH in the last 168 hours.     - No results found for: FREET4  Endocrine    - Recent Labs Lab 04/01/210516 04/01/210539 GLU 201* 197*  Endocrine -  Lab Results Component Value Date  HGBA1C 6.6 (H) 01/09/2019       Pending Labs: Pending Lab Results   Order Current Status  Drugs of abuse, urine In process  SARS CoV-2 (COVID-19) RNA - [place] [facility] ([facility]) In process     EKG Personally reviewed , normal sinus rhytma, no new acute st/t wave changes CXR Personally reviewed - clear lungs/ no plueral effusion  Other Tests [place]: no acute pathology* ASSESSMENT 44 y.o. female with history of recurrent pancreatitis ( first episode related to alcohol, also  has high TG--> sober for more than a year), pregnancy related seizure/PE (currently not on [facility]) was not feeling well since yesterday---> found to have high lipase.  Problem List: Dizziness: likely related to hypovolemia ( low BP in ER even after iv fluid bolus)Acute recurrent pancreatitis ( reports being sober for > 1 year, alcohol level 0.02)Hx of PEHypertriglyceridemia - mild PLAN - clear diet- PPI- iv RL - GI consult- orthostatic BP- f/w UA, no signs of sepsis at present- Head [place] unremarkable. Low suspicion for central process --> evaluate again for further need of imaging based on her clinical course. - continue her home meds Lamictal/wellbutrin- hold amlodipine due to soft BPHome Medication on Hold : amlodipine ( pharmacy to verify dose GI Prophylaxis: PPIVTE Prophylaxis: enoxaparin-	     REFERRALS/ Consults- GII expect more than 48hr of inpatient stay given clinical condition, co morbidities and social issues. Plans discussed with  teamCode status: full I confirmed that the patient's Advance Care Plan is present, code status is documented, or surrogate decision maker is listed in the patient's medical recordPrior SIGNATURE: Johny Shock, MD.  Pager [phone]

## 2019-07-31 NOTE — Utilization Review (ED)
UM Status: Commercial - IP Pancreatitis, vertigoSusan Kendell Bane, BSN, RN [facility] ED Case [phone]

## 2019-07-31 NOTE — ED Provider Notes
CHIEF COMPLAINTChief Complaint Patient presents with ? Dizziness   pt reports recent onset of dizziness and seeing double.  pt sts she feels off balance and needing to hold onto items to walk.  pt denies recent trauma, strenuous activity, alterations in food/fluid intake.  pt sts she was unable to sleep yesterday.  denies numbness/tingling, h/a. HISTORY OF PRESENT ILLNESS44 year old female with past medical history of hypertension, paroxysmal ventricular tachycardia, PE, seizures, pancreatitis presenting with a complaint of dizziness.  Patient states that her symptoms started yesterday and her dizziness feels as if she is having difficulty balancing.  She states that she feels as if she is seeing double.  She states that the dizziness gets worse with position changes and if she goes from sitting to standing.  She states that when walking she feels as if she needs to hold on to things and has bumped into walls at home.  She denies that she has ever had similar symptoms in the past.  She admits that she has also had some nausea with the dizziness.  Yesterday after the dizziness began she did have 2 episodes of nonbloody/bilious emesis.  She denies any headache, tinnitus, neck stiffness, numbness, tingling, weakness, chest pain, shortness of breath.  She states that she does have some very mild epigastric pain however states that she does not feel as if she is having a flare of her pancreatitis.  She denies any changes in her bowels, urinary symptoms.  No fever, chills, rash.  She denies any head injury or trauma.  She denies any alcohol use and states that she has been sober for a year and a half.  She denies any illicit drug use.  She denies taking any medications at home for her symptoms.I have reviewed the following:  Past Medical History: Diagnosis Date ? Encounter for blood transfusion   during 2nd pregnancy ? Essential hypertension 10/12/2009 ? High cholesterol  ? History of abnormal cervical Papanicolaou smear  ? Pancreatic cyst 07/2018 ? Pancreatitis  ? Paroxysmal ventricular tachycardia (HC Code) 10/12/2009  RT OUTFLOW TRACT/ ablations x 6 ? Pulmonary embolism (HC Code) 2008  no blood thinners at this time ? Seizures (HC Code)   during pregnancy Past Surgical History: Procedure Laterality Date ? CARDIAC ELECTROPHYSIOLOGY MAPPING AND ABLATION  04/23/2008  [place] ? CERVICAL CONE BIOPSY   ? CESAREAN SECTION   ? MIBI Exercise stress/test (NM)  10/08/2007  [facility] ? MOUTH SURGERY   ? TONSILLECTOMY   ? TUBAL LIGATION   ? UPPER GASTROINTESTINAL ENDOSCOPY   Family History Problem Relation Age of Onset ? Heart disease Father       Myocardial Infarction ? No Known Problems Mother  ? Breast cancer Maternal Grandmother  Social History Socioeconomic History ? Marital status: Single   Spouse name: Not on file ? Number of children: Not on file ? Years of education: Not on file ? Highest education level: Not on file Occupational History ? Not on file Social Needs ? Financial resource strain: Not on file ? Food insecurity   Worry: Not on file   Inability: Not on file ? Transportation needs   Medical: Not on file   Non-medical: Not on file Tobacco Use ? Smoking status: Current Some Day Smoker   Packs/day: 0.50   Types: Cigarettes ? Smokeless tobacco: Never Used Substance and Sexual Activity ? Alcohol use: Not Currently   Comment: daily 2-3 drinks/day ? Drug use: Yes   Frequency: 7.0 times per week   Types: Marijuana ? Sexual  activity: Yes Lifestyle ? Physical activity   Days per week: Not on file   Minutes per session: Not on file ? Stress: Not on file Relationships ? Social Manufacturing systems engineer on phone: Not on file   Gets together: Not on file   Attends religious service: Not on file   Active member of club or organization: Not on file   Attends meetings of clubs or organizations: Not on file   Relationship status: Not on file ? Intimate partner violence   Fear of current or ex partner: Not on file   Emotionally abused: Not on file   Physically abused: Not on file   Forced sexual activity: Not on file Other Topics Concern ? Not on file Social History Narrative ? Not on file Current Facility-Administered Medications Medication ? lactated Ringers infusion Current Outpatient Medications Medication Sig ? amLODIPine (NORVASC) 10 mg tablet Take 1 tablet (10 mg total) by mouth daily. ? atorvastatin (LIPITOR) 20 MG tablet Take 20 mg by mouth daily.. ? buPROPion SR (WELLBUTRIN SR) 150 mg 12 hr tablet Take 300 mg by mouth daily.  ? lamoTRIgine (LAMICTAL) 100 mg immediate release tablet Take 100 mg by mouth daily. With 50 mg total dose = 150 mg daily ? ondansetron (ZOFRAN-ODT) 4 mg disintegrating tablet Place 4 mg onto the tongue every 8 (eight) hours as needed for nausea. ? pantoprazole (PROTONIX) 40 mg tablet Take 1 tablet (40 mg total) by mouth every 12 (twelve) hours. (Patient taking differently: Take 40 mg by mouth daily. ) ? PRENATAL VITAMIN PLUS LOW IRON 27 mg iron- 1 mg Tab tablet Take 1 tablet by mouth daily. *OTC ? VASCEPA 1 gram capsule TAKE 2 TABLETS BY MOUTH TWICE A DAY Allergies Allergen Reactions ? Lisinopril Swelling   Angioedema on May 11, 2018 ? Hydrochlorothiazide Other (See Comments)   Electrolyte imbalance REVIEW OF SYSTEMS10 systems reviewed, pertinent positives per HPI otherwise noted to be negative.PHYSICAL EXAMED Triage VitalsBP: (!) 136/92 [07/31/19 0512]Pulse: (!) 113 [07/31/19 0512]Pulse from  O2 sat: (!) 104 [07/31/19 0530]Resp: 20 [07/31/19 0512]Temp: 98.2 ?F (36.8 ?C) [07/31/19 0512]Temp src: Oral [07/31/19 0512]SpO2: 99 % [07/31/19 0512]Body mass index is 24.27 kg/m?Marland KitchenGeneral:  44 year old female of normal body habitus who appears of stated age resting on the stretcher in no acute distress.  She is pleasant and cooperative.Skin: Warm and dry without visible rashes.HEENT: Normocephalic and atraumatic. Pupils are equal, round and reactive to light. Extraocular motions are intact.  Rightward horizontal nystagmus noted.  Sclerae and conjunctivae are benign. Nares patent. Oropharynx shows moist mucous membranes.  Equal rise and fall of the uvula.  Neck: Supple. Normal movements.  No midline cervical tenderness to palpation, deformities, step-offs.  No paraspinal tenderness to palpation.  No meningeal signs.Chest: Normal effort of breathing. Clear to auscultation bilaterally without rales, wheezes or rhonchi.Cardiac: Regular rate and rhythm. Normal S1 and S2. No rubs, murmurs or gallops appreciated.Abdomen:  Normoactive bowel sounds throughout all quadrants.  Soft, nondistended, mild tenderness to palpation in the epigastric region without any guarding, rebound tenderness, rigidity.Extremities: No clubbing, cyanosis or edema.Musculoskeletal: No swelling or deformity.  Full range of motion throughout all extremities with 5/5 strength.Neuropsych: Oriented to person, place, time and situation. Mood and affect are appropriate.  Cranial nerves 2-12 intact.  No pronator drift bilaterally.  Equal grip strength bilaterally.  Finger-to-nose and heel-to-shin testing reveals no limb ataxia.  Able to distinguish light touch throughout without difficulty.  2+ patellar reflexes bilaterally.  Negative test of skew.  LABS/IMAGING/DATAI have reviewed  all labs for this visit. Recent Results (from the past 24 hour(s)) POC Glucose (Fingerstick)  Collection Time: 07/31/19  5:16 AM Result Value Ref Range  Glucose, Meter 201 (H) 65 - 110 mg/dL Lipase  Collection Time: 07/31/19  5:39 AM Result Value Ref Range  Lipase 1,452 (H) 73 - 393 U/L Ethanol  Collection Time: 07/31/19  5:39 AM Result Value Ref Range Ethanol Level 0.02 (H) <0.01 g/dL hCG, serum, qualitative  Collection Time: 07/31/19  5:39 AM Result Value Ref Range  Serum Pregnancy-Qualitative Negative Negative Comprehensive metabolic panel  Collection Time: 07/31/19  5:39 AM Result Value Ref Range  Sodium 137 136 - 145 mmol/L  Potassium 3.8 3.5 - 5.1 mmol/L  Chloride 107 98 - 107 mmol/L  CO2 22 21 - 32 mmol/L  Anion Gap 8 5 - 15 mmol/L  Glucose 197 (H) 65 - 110 mg/dL  BUN 11 7 - 18 mg/dL  Creatinine 1.61 0.96 - 1.02 mg/dL  eGFR (AFRICAN-AMERICAN) >60 >60 mL/min/1.19m2  eGFR (NON African-American) >60 >60 mL/min/1.54m2  Calcium 9.9 8.5 - 10.1 mg/dL  Total Protein 8.1 6.4 - 8.2 g/dL  Albumin 4.4 3.4 - 5.0 g/dL  Globulin 3.7 2.5 - 5.0 g/dL  Total Bilirubin 0.3 <0.4 mg/dL  Alkaline Phosphatase 96 45 - 117 U/L  Alanine Aminotransferase (ALT) 20 13 - 56 U/L  Aspartate Aminotransferase (AST) 9 (L) 15 - 37 U/L CBC auto differential  Collection Time: 07/31/19  5:40 AM Result Value Ref Range  WBC 11.43 (H) 4.00 - 10.00 x1000/?L  RBC 4.48 4.00 - 5.20 M/?L  Hemoglobin 13.4 11.0 - 15.0 g/dL  Hematocrit 54.0 98.1 - 45.0 %  MCV 90.6 79.0 - 99.0 fL  MCH 29.9 27 - 33 pg  MCHC 33.0 32.0 - 36.0 g/dL  RDW-CV 19.1 47.8 - 29.5 %  Platelets 363 140 - 400 x1000/?L  MPV 8.8 7.5 - 11.5 fL  Neutrophils 63.5 36.0 - 66.0 %  Lymphocytes 29.0 25.0 - 45.0 %  Monocytes 4.5 0.0 - 12.0 %  Eosinophils 1.9 0.0 - 4.0 %  Basophil 0.8 0.0 - 3.0 %  Immature Granulocytes 0.3 0.0 - 1.0 %  nRBC 0.0 0.0 - 5.0 %  ANC (Abs Neutrophil Count) 7.25 (H) 1.40 - 6.60 x 1000/?L  Absolute Lymphocyte Count 3.32 1.00 - 4.50 x 1000/?L  Monocyte Absolute Count 0.52 0.00 - 1.20 x 1000/?L  Eosinophil Absolute Count 0.22 0.00 - 0.45 x 1000/?L  Basophil Absolute Count 0.09 0.0 - 0.3 x 1000/?L  Absolute Immature Granulocyte Count 0.03 0.00 - 0.10 x 1000/?L ED COURSE/MDM1. Vertigo2. Pancreatitis89 year old female with past medical history of hypertension, paroxysmal ventricular tachycardia, PE, seizures, pancreatitis presenting with a complaint of dizziness.  Patient states that her symptoms started yesterday and her dizziness feels as if she is having difficulty balancing.  She states that she feels as if she is seeing double.  She states that the dizziness gets worse with position changes and if she goes from sitting to standing.  She does also admit to nausea and a couple episodes of vomiting yesterday.  Here in the ED she is afebrile, initially tachycardic which improved without intervention.  Otherwise vitals are stable.  No focal neurologic deficits on exam.  She does have horizontal rightward nystagmus.  Negative test of skew.  Symptoms are worsened with position changes.  I believe this is most likely secondary to peripheral vertigo and I have low concern for central vertigo at this time.  Labs reveal leukocytosis of 11.4, no anemia, hyperglycemia  of 197 but no other significant electrolyte disturbance or renal dysfunction.  No hepatic dysfunction however she does have an elevated lipase of 1452. Addendum 8:18 a.m.:  Upon re-evaluation she still complaining of vertigo even after meclizine, Ativan, Benadryl.  She also admits that her abdominal pain is somewhat worse.  Given that she is having worsening abdominal pain, her lipase is elevated, and she is having intractable vertigo I did discuss case with hospitalist Dr. Rosita Fire and patient will be admitted for further workup and management.  I did also discuss case with my attending Dr. Aneta Mins agrees with plan of care.  Patient updated on the plan of care and all questions answered.During the patient's ED course, the patient was given:Medications lactated Ringers infusion (has no administration in time range) sodium chloride 0.9 % (new bag) bolus 1,000 mL (0 mLs Intravenous Stopped 07/31/19 0639) meclizine (ANTIVERT) tablet 25 mg (25 mg Oral Given 07/31/19 0549) lactated ringers (new bag) bolus 1,000 mL (0 mLs Intravenous Stopped 07/31/19 0826) LORazepam (ATIVAN) injection 1 mg (1 mg IV Push Given 07/31/19 0728) diphenhydrAMINE (BENADRYL) injection 25 mg (25 mg IV Push Given 07/31/19 0801)  CLINICAL IMPRESSION:  SNOMED [place](R) 1. Chronic pancreatitis, unspecified pancreatitis type (HC Code)  CHRONIC PANCREATITIS 2. Vertigo  VERTIGO DISPOSITION:ED Disposition   ED Disposition Condition Comment  Admit Stable Are you concerned about COVID-19?  Answer 'Yes' to this question if based on your clinical judgment you feel patient should be placed in a COVID rule-out area regardless of initial COVID test results.: No Attending Provider: Johny Shock [[id number]] CMS Certification: CMS Certification: Inpatient admission is necessary due to illness severity, service intensity, medical complexity, and risk of adverse events.  I expect the hospitalization will span at least two midnights. Which Delivery Network?: [facility] Medicine/Surgical Teams: Hospitalist ED Fall Risk: Yes  Does Pt Meet Any of the following Special Needs Classifications: NA Does Patient Require Telemetry: No Does patient require isolation?: No Does patient have Sepsis? (Infection AND End Organ Damage, e.g. Elevated Lactate): No Provider Contact Number: Hospitalist   Ronney Lion, PA04/01/21 1610

## 2019-07-31 NOTE — Plan of Care
Problem: Adult Inpatient Plan of CareGoal: Plan of Care ReviewOutcome: Interventions implemented as appropriateGoal: Patient-Specific Goal (Individualized)Outcome: Interventions implemented as appropriateGoal: Absence of Hospital-Acquired Illness or InjuryOutcome: Interventions implemented as appropriateGoal: Optimal Comfort and WellbeingOutcome: Interventions implemented as appropriateGoal: Readiness for Transition of CareOutcome: Interventions implemented as appropriate Admission Note Nursing Natasha English is a 44 y.o. female admitted with a chief complaint of pancreatitis. Patient arrived from  ED.Patient is   A+OX4. Vitals:  07/31/19 0800 07/31/19 0900 07/31/19 1114 07/31/19 1349 BP: 103/66 104/63 (!) 100/50 117/67 Pulse:    (!) 91 Resp:    18 Temp:    98.8 ?F (37.1 ?C) TempSrc:    Oral SpO2:    95% Weight:    60.2 kg Height:    5' 2 (1.575 m) Oxygen therapy Oxygen TherapySpO2: 95 %Device (Oxygen Therapy): room airI have reviewed the patient's current medication orders.Current Facility-Administered Medications Medication Dose Route Frequency Provider Last Rate Last Admin  [Held by provider] amLODIPine (NORVASC) tablet 10 mg  10 mg Oral Daily Johny Shock, MD      [START ON 08/01/2019] buPROPion XL (WELLBUTRIN XL) 24 hr tablet 300 mg  300 mg Oral Daily Johny Shock, MD      Melene Muller ON 08/01/2019] enoxaparin (LOVENOX) syringe 40 mg  40 mg Subcutaneous Q24H Johny Shock, MD      lamoTRIgine (laMICtal) Immediate Release tablet 200 mg  200 mg Oral Daily Johny Shock, MD      pantoprazole (PROTONIX) EC tablet 40 mg  40 mg Oral Q12H Johny Shock, MD   40 mg at 07/31/19 1246  [START ON 08/01/2019] rosuvastatin (CRESTOR) tablet 10 mg  10 mg Oral Daily Johny Shock, MD      sodium chloride 0.9 % flush 3 mL  3 mL IV Push Q8H Johny Shock, MD      morphine (ADULT), ondansetron **OR** ondansetron (ZOFRAN) IV Push, polyethylene glycol, sodium chloride.See flowsheets, patient education and plan of care for additional information. Plan of Care Overview/ Patient Status    Pt A+OX4. VSS. IVF continued. PRN morphine and zofran given for abdominal pain/nausea. Urine sample to be collected. Hourly rounding maintained, bed alarm set.

## 2019-07-31 NOTE — ED Notes
6:54 AM Report given to [employer];

## 2019-07-31 NOTE — Other
PHARMACY-ASSISTED MEDICATION REPORTPharmacist review of the best possible medication history obtained by the pharmacy medication history technician has been performed.  I have updated the home medication list and identified the following information that may be relevant to this admission.NOTES/RECOMMENDATIONS Medication list was updated to best possible state. Patient  provided good medication history including exact dose/frequency during interview. The following resources were utilized to confirm the list: Pharmacy record ([facility] sure scripts database). Medication reconciliation completed prior to admission orders, please re-start home medications when appropriate. No current facility-administered medications on file prior to encounter.  Current Outpatient Medications on File Prior to Encounter Medication Sig Note Last Dose  amLODIPine (NORVASC) 10 mg tablet Take 1 tablet (10 mg total) by mouth daily.  07/30/2019 at Unknown time  atorvastatin (LIPITOR) 20 MG tablet Take 20 mg by mouth daily.Marland Kitchen    buPROPion SR (WELLBUTRIN SR) 150 mg 12 hr tablet Take 300 mg by mouth daily.   07/31/2019 at Unknown time  lamoTRIgine (LAMICTAL) 200 mg immediate release tablet Take 200 mg by mouth daily. With 50 mg total dose = 150 mg daily  07/31/2019 at Unknown time  ondansetron (ZOFRAN-ODT) 4 mg disintegrating tablet Place 4 mg onto the tongue every 8 (eight) hours as needed for nausea. 07/31/2019: MedHX Tech(Josephine Scarpa, CPHT):  Patient using as PRN    pantoprazole (PROTONIX) 40 mg tablet Take 1 tablet (40 mg total) by mouth every 12 (twelve) hours. (Patient taking differently: Take 40 mg by mouth daily. )  07/30/2019 at Unknown time  PRENATAL VITAMIN PLUS LOW IRON 27 mg iron- 1 mg Tab tablet Take 1 tablet by mouth daily. *OTC  07/31/2019 at Unknown time  VASCEPA 1 gram capsule TAKE 2 TABLETS BY MOUTH TWICE A DAY (Patient taking differently: Take 2 g by mouth 2 (two) times daily. ) Thank you,Arshi Duarte, PharmD4/1/202110:04 AMPhone: [phone]

## 2019-08-01 DIAGNOSIS — F17210 Nicotine dependence, cigarettes, uncomplicated: Secondary | ICD-10-CM

## 2019-08-01 DIAGNOSIS — F1290 Cannabis use, unspecified, uncomplicated: Secondary | ICD-10-CM

## 2019-08-01 DIAGNOSIS — I10 Essential (primary) hypertension: Secondary | ICD-10-CM

## 2019-08-01 DIAGNOSIS — F1010 Alcohol abuse, uncomplicated: Secondary | ICD-10-CM

## 2019-08-01 DIAGNOSIS — E7800 Pure hypercholesterolemia, unspecified: Secondary | ICD-10-CM

## 2019-08-01 DIAGNOSIS — K862 Cyst of pancreas: Secondary | ICD-10-CM

## 2019-08-01 DIAGNOSIS — I472 Ventricular tachycardia: Secondary | ICD-10-CM

## 2019-08-01 DIAGNOSIS — F319 Bipolar disorder, unspecified: Secondary | ICD-10-CM

## 2019-08-01 DIAGNOSIS — E785 Hyperlipidemia, unspecified: Secondary | ICD-10-CM

## 2019-08-01 DIAGNOSIS — K859 Acute pancreatitis without necrosis or infection, unspecified: Secondary | ICD-10-CM

## 2019-08-01 DIAGNOSIS — Z20822 Contact with and (suspected) exposure to covid-19: Secondary | ICD-10-CM

## 2019-08-01 DIAGNOSIS — E781 Pure hyperglyceridemia: Secondary | ICD-10-CM

## 2019-08-01 DIAGNOSIS — E861 Hypovolemia: Secondary | ICD-10-CM

## 2019-08-01 DIAGNOSIS — Z86711 Personal history of pulmonary embolism: Secondary | ICD-10-CM

## 2019-08-01 DIAGNOSIS — Z79899 Other long term (current) drug therapy: Secondary | ICD-10-CM

## 2019-08-01 DIAGNOSIS — K861 Other chronic pancreatitis: Secondary | ICD-10-CM

## 2019-08-01 DIAGNOSIS — H532 Diplopia: Secondary | ICD-10-CM

## 2019-08-01 DIAGNOSIS — K863 Pseudocyst of pancreas: Secondary | ICD-10-CM

## 2019-08-01 LAB — CBC WITH AUTO DIFFERENTIAL
BKR WAM ABSOLUTE IMMATURE GRANULOCYTES.: 0.02 x 1000/ÂµL (ref 0.00–0.10)
BKR WAM ABSOLUTE LYMPHOCYTE COUNT.: 3.48 x 1000/ÂµL (ref 1.00–4.50)
BKR WAM ABSOLUTE NEUTROPHIL COUNT.: 4.35 x 1000/ÂµL (ref 1.40–6.60)
BKR WAM BASOPHIL ABSOLUTE COUNT.: 0.07 x 1000/ÂµL (ref 0.0–0.3)
BKR WAM BASOPHILS: 0.8 % (ref 0.0–3.0)
BKR WAM EOSINOPHIL ABSOLUTE COUNT.: 0.24 x 1000/ÂµL (ref 0.00–0.45)
BKR WAM EOSINOPHILS: 2.7 % (ref 0.0–4.0)
BKR WAM HEMATOCRIT: 33.2 % — ABNORMAL LOW (ref 34.0–45.0)
BKR WAM HEMOGLOBIN: 10.9 g/dL — ABNORMAL LOW (ref 11.0–15.0)
BKR WAM IMMATURE GRANULOCYTES: 0.2 % (ref 0.0–1.0)
BKR WAM LYMPHOCYTES: 39.7 % (ref 25.0–45.0)
BKR WAM MCH PG: 29.9 pg (ref 27–33)
BKR WAM MCHC: 32.8 g/dL (ref 32.0–36.0)
BKR WAM MCV: 91.2 fL (ref 79.0–99.0)
BKR WAM MONOCYTE ABSOLUTE COUNT.: 0.6 x 1000/ÂµL (ref 0.00–1.20)
BKR WAM MONOCYTES: 6.8 % (ref 0.0–12.0)
BKR WAM MPV: 8.9 fL (ref 7.5–11.5)
BKR WAM NEUTROPHILS: 49.8 % (ref 36.0–66.0)
BKR WAM NUCLEATED RED BLOOD CELLS: 0 % (ref 0.0–5.0)
BKR WAM PLATELETS: 264 x1000/ÂµL (ref 140–400)
BKR WAM RDW-CV: 13.8 % (ref 11.5–14.5)
BKR WAM RED BLOOD CELL COUNT.: 3.64 M/ÂµL — ABNORMAL LOW (ref 4.00–5.20)
BKR WAM WHITE BLOOD CELL COUNT.: 8.76 x1000/ÂµL (ref 4.00–10.00)

## 2019-08-01 LAB — BASIC METABOLIC PANEL
BKR ANION GAP (LM): 5 mmol/L (ref 5–15)
BKR BLOOD UREA NITROGEN: 7 mg/dL (ref 7–18)
BKR CALCIUM: 8.9 mg/dL (ref 8.5–10.1)
BKR CHLORIDE: 110 mmol/L — ABNORMAL HIGH (ref 98–107)
BKR CO2: 26 mmol/L (ref 21–32)
BKR CREATININE: 0.58 mg/dL (ref 0.55–1.02)
BKR EGFR (AFR AMER) (LMC): >60 mL/min/{1.73_m2} (ref >60)
BKR EGFR (NON AFR AMER) (LMC): >60 mL/min/{1.73_m2} (ref >60)
BKR GLUCOSE: 110 mg/dL (ref 65–110)
BKR POTASSIUM: 3.9 mmol/L (ref 3.5–5.1)
BKR SODIUM: 141 mmol/L (ref 136–145)

## 2019-08-01 LAB — URINALYSIS WITH CULTURE REFLEX      (BH LMW YH)
BKR BILIRUBIN, UA MG/DL: NEGATIVE mg/dL
BKR GLUCOSE, UA MG/DL: NEGATIVE mg/dL
BKR KETONES, UA MG/DL: NEGATIVE mg/dL
BKR LEUKOCYTE ESTERASE, UA: NEGATIVE Leu/uL
BKR NITRITE, UA: NEGATIVE
BKR PH, UA: 6 (ref 5.0–7.0)
BKR PROTEIN, UA.: NEGATIVE mg/dL
BKR RBC/HPF INSTRUMENT: 1 /HPF (ref 0–3)
BKR SPECIFIC GRAVITY, UA: 1.008 (ref 1.003–1.035)
BKR URINE SQUAMOUS EPITHELIAL CELLS, UA (NUMERIC): 5 /HPF (ref 0–5)
BKR UROBILINOGEN, UA - ALPHA: NEGATIVE EU/dL
BKR WBC/HPF INSTRUMENT: 1 /HPF (ref 0–5)

## 2019-08-01 LAB — LIPASE: BKR LIPASE: 104 U/L (ref 73–393)

## 2019-08-01 LAB — UA REFLEX CULTURE

## 2019-08-01 MED ORDER — SODIUM CHLORIDE 0.9 % BOLUS (NEW BAG)
0.9 % | Freq: Once | INTRAVENOUS | Status: DC
Start: 2019-08-01 — End: 2019-08-01

## 2019-08-01 MED ORDER — AMLODIPINE 10 MG TABLET
10 mg | ORAL_TABLET | Freq: Every day | ORAL | 1 refills | Status: AC
Start: 2019-08-01 — End: 2019-10-19

## 2019-08-01 NOTE — Discharge Summary
[facility]	Med/Surg Discharge SummaryPatient Data:  Patient Name: Natasha English Age: 44 y.o. DOB: 01-11-1976	 MRN: [id number]	  PCP: Sherrilee Gilles, MD Discharged Condition: goodDisposition: Home  Admit date: 4/1/2021Discharge date: 4/2/2021Length of Stay 1 day(s) Principal Diagnosis: Chronic pancreatitis, unspecified pancreatitis type (HC Code)Other Diagnosis: Dizziness and lightheadednessHypertensionHyperlipidemiaParoxysmal V-tachBipolar disorderHypertriglyceridemia/dyslipidemia Admission HPI 44?y/o F, PMH recurrent pancreatitis with pseudocyst, alcohol abuse (sober since 03/2018),?hypertriglyceridemia/dyslipidemia, Bipolar disorder, HTN, HLD, paroxysmal VT?who presented with vertigo. She reports yesterday she had significant fatigue and slept most of the day, and had mild dizziness. Today woke up with significant dizziness, double vision. Nausea with 2 episodes of emesis. She reports two days ago she started to develop epigastric discomfort. WBC 11, Lipase 1400, triglycerides 245. COVID negative, has been fully vaccinated. Heartburn is helped by PPI BID. She notes 20 lb weight loss in past 4-5 months, her PCP had previously started her on Wellbutrin for this. Smokes marijuana twice daily. Denies recent new medications, alcohol use, CP, SOB. She does report a couple weeks ago she saw her PCP for a significant amount of abnormal vaginal bleeding.  CXray unremarkable.  head without any acute findings. ? Hospital course:   44 year old female with past medical history of a chronic pancreatitis, hypertension, hyperlipidemia, bipolar disorder, alcohol abuse present to the hospital due to dizziness and lightheadedness improved with IV hydration regular lactate 1 L and maintenance.  Orthostatic was positive in the emergency room repeated this afternoon was negative.  Patient was seen by gastroenterologist for abdominal pain due to acute on chronic pancreatitis lipase were elevated 1400 seems like around her baseline.  Patient has recent endoscopic ultrasound Cl due to surge assessed at blocking the canal patient will follow up with GI as outpatient.Patient stop taking amlodipine if her blood pressure below 120 systolic, patient to monitor her blood pressure at home with cough intake records to her primary care physician.  I dictated the patient about keeping herself hydrated.  Patient is hemodynamically stable to be discharged home with outpatient follow-up. Issues to be addressed post discharge:  1 follow-up with GI and PCP as outpatientAllergies Allergies Allergen Reactions ? Lisinopril Swelling   Angioedema on May 11, 2018 ? Hydrochlorothiazide Other (See Comments)   Electrolyte imbalance  Discharge Medications: Current Discharge Medication List  CONTINUE these medications which have CHANGED  Details amLODIPine (NORVASC) 10 mg tablet Take 1 tablet (10 mg total) by mouth daily. Hold for blood pressure below 120 systolicQty: 30 tablet, Refills: 0Start date: 08/01/2019   CONTINUE these medications which have NOT CHANGED  Details atorvastatin (LIPITOR) 20 MG tablet Take 20 mg by mouth daily.Marland KitchenRefills: 0  buPROPion SR (WELLBUTRIN SR) 150 mg 12 hr tablet Take 300 mg by mouth daily.   lamoTRIgine (LAMICTAL) 200 mg immediate release tablet Take 200 mg by mouth daily.   ondansetron (ZOFRAN-ODT) 4 mg disintegrating tablet Place 4 mg onto the tongue every 8 (eight) hours as needed for nausea.  pantoprazole (PROTONIX) 40 mg tablet Take 1 tablet (40 mg total) by mouth every 12 (twelve) hours.Qty: 30 tablet, Refills: 2  PRENATAL VITAMIN PLUS LOW IRON 27 mg iron- 1 mg Tab tablet Take 1 tablet by mouth daily. *OTCRefills: 3  VASCEPA 1 gram capsule TAKE 2 TABLETS BY MOUTH TWICE A DAYQty: 120 capsule, Refills: 12   Last 24 hours: Temp:  [97.9 ?F (36.6 ?C)-98.8 ?F (37.1 ?C)] 98.2 ?F (36.8 ?C)Pulse:  [79-91] 83Resp:  [18] 18BP: (114-127)/(62-67) 127/67SpO2:  [95 %-96 %] 96 %Temp (24hrs), Avg:98.3 ?F (36.8 ?C), Min:97.9 ?F (  36.6 ?C), Max:98.8 ?F (37.1 ?C)Gross Totals (Last 24 hours) at 08/01/2019 1143Last data filed at 08/01/2019 0620Intake 3050.01 ml Output -- Net 3050.01 ml Physical Exam:General : well appearing in NAD Head: NC/AT ENT: PERLA, EOMI, an icteric sclera.Neck : supple , no JVD Heart : RRR, no M, R, G, normal S1, S2 Respiratory: CTA B/L, no W/R/RAbdomen soft , mid epigastric and and dermis and right upper quadrant tenderness, ,ND, Normal BS , all 4 Q Neuro: non focal Skin: warm , dry , LE : +2 Pulse , no edema , FROMPsych: normal mood and effect Time Spent on Discharge, including care coordination: 30 min  Dietary Orders (From admission, onward)     Start     Ordered  07/31/19 1300  Diet Fat Restricted  DIET EFFECTIVE NOW   Question Answer Comment Fat Restriction: 50 gm fat  Initiate Nutrition Management Protocol (Yes/No?) Yes - Initiate Protocol    07/31/19 1311    Follow-up Information:Watts, Vernona Rieger [address] [place] [phone] an appointment as soon as possible for a visit in 1 weekOuellette, Hezzie Bump, MD[address] [phone] an appointment as soon as possible for a visit in 1 week Pertinent lab findings and test results:  Pending Labs and Tests:  Pending Lab Results   Order Current Status  Lipase In process  Recent labs.  Labs Hematology       - CBCRecent Labs Lab 04/01/210540 04/02/210703 WBC 11.43* 8.76 HGB 13.4 10.9* HCT 40.6 33.2* PLT 363 264  Hematology         - CoagsNo results for input(s): INR, PTT in the last 168 hours. Renal Recent Labs Lab 04/01/210539 04/02/210703 NA 137 141 K 3.8 3.9 CL 107 110* CO2 22 26 BUN 11 7 CREATININE 0.67 0.58 CALCIUM 9.9 8.9  GI/GU       - LFTsRecent Labs Lab 04/01/210539 ALT 20 AST 9* ALKPHOS 96 BILITOT 0.3     - LIPASERecent Labs Lab 04/01/210539 LIPASE 1,452*       - No results for input(s): AMMONIA in the last 168 hours. Cardiovascular     - Cardiac EnzymesNo results for input(s): TROPONINI, CKTOTAL, CKMB in the last 168 hours.Invalid input(s): TROPONINPOC     - pBNPNo components found for: PROBNP Endocrine   - No results for input(s): TSH in the last 168 hours.     - No results found for: FREET4  Endocrine    - Recent Labs Lab 04/01/210516 04/01/210539 04/02/210703 GLU 201* 197* 110  Endocrine -  Lab Results Component Value Date  HGBA1C 6.6 (H) 01/09/2019       Pending Labs: Pending Lab Results   Order Current Status  Lipase In process     Cultures:Lab Results Component Value Date  LABBLOO No Growth after 5 days of incubation 01/06/2018  LABBLOO No Growth after 5 days of incubation 01/06/2018  LABBLOO Abnormal Stain (AA) 01/03/2018  LABBLOO Aerobic Bottle Micrococcus luteus (A) 01/03/2018 ACE/ARB no BB Therapy no No results found for this or any previous visit.PMH PSH Past Medical History: Diagnosis Date ? Encounter for blood transfusion   during 2nd pregnancy ? Essential hypertension 10/12/2009 ? High cholesterol  ? History of abnormal cervical Papanicolaou smear  ? Pancreatic cyst 07/2018 ? Pancreatitis  ? Paroxysmal ventricular tachycardia (HC Code) 10/12/2009  RT OUTFLOW TRACT/ ablations x 6 ? Pulmonary embolism (HC Code) 2008  no blood thinners at this time ? Seizures (HC Code)   during pregnancy  Past Surgical History: Procedure Laterality Date ?  CARDIAC ELECTROPHYSIOLOGY MAPPING AND ABLATION  04/23/2008  [place] ? CERVICAL CONE BIOPSY   ? CESAREAN SECTION   ? MIBI Exercise stress/test (NM)  10/08/2007  [facility] ? MOUTH SURGERY   ? TONSILLECTOMY   ? TUBAL LIGATION   ? UPPER GASTROINTESTINAL ENDOSCOPY    Social History Family History Social History Tobacco Use ? Smoking status: Current Some Day Smoker   Packs/day: 0.50   Types: Cigarettes ? Smokeless tobacco: Never Used Substance Use Topics ? Alcohol use: Not Currently   Comment: daily 2-3 drinks/day  Family History Problem Relation Age of Onset ? Heart disease Father       Myocardial Infarction ? No Known Problems Mother  ? Breast cancer Maternal Grandmother   This report was created in part from templates and voice recognition software. Typographical and minor dictation errors may be present.Electronically Signed:Deetta Siegmann, MD Internal Medicine Attending 08/01/2019 11:43 AMBest Contact Information: [id number] : NONE

## 2019-08-01 NOTE — Plan of Care
Plan of Care Overview/ Patient Status    A/Ox4. VSS. PRN morphine for report of abd pain x2. No reports of nausea. LR @ 200 continued. Urine sample obtained and sent. Pt rested in bed comfortably. Safety rounding maintained.

## 2019-08-01 NOTE — Discharge Instructions
Hold amlodipine for low blood pressure if it is below 120 systolicKeep yourself hydratedFollow-up with your primary care physician within 1 weekFollow-up with your gastroenterologist within 1 week.If you feel dizzy lightheaded chest pain or severe abdominal pain please come back to the emergency room.

## 2019-08-01 NOTE — Progress Notes
[facility]	GI Progress NoteHeather L Socha1977/12/19Attending Provider: Johny Shock, MD Subjective: Interim History:Patient feels back to baseline with no abdominal pain.  Tolerating regular diet.  No neurologic symptoms at this point.Review of Allergies/Meds/Hx: Allergies Allergen Reactions ? Lisinopril Swelling   Angioedema on May 11, 2018 ? Hydrochlorothiazide Other (See Comments)   Electrolyte imbalance I have reviewed the patient's allergies, prior to admission meds, past medical/surgical, family and social hx. Inpatient Medications:Scheduled Medications: Current Facility-Administered Medications Medication Dose Route Frequency Provider Last Rate Last Admin ? [Held by provider] amLODIPine (NORVASC) tablet 10 mg  10 mg Oral Daily Johny Shock, MD     ? buPROPion XL (WELLBUTRIN XL) 24 hr tablet 300 mg  300 mg Oral Daily Johny Shock, MD   300 mg at 08/01/19 0805 ? enoxaparin (LOVENOX) syringe 40 mg  40 mg Subcutaneous Q24H Johny Shock, MD   40 mg at 08/01/19 0805 ? lamoTRIgine (laMICtal) Immediate Release tablet 200 mg  200 mg Oral Daily Johny Shock, MD   200 mg at 08/01/19 0805 ? nicotine (NICODERM CQ) transdermal patch 24 hr 14 mg  14 mg Transdermal Daily Marice Potter, MD   14 mg at 08/01/19 0804 ? pantoprazole (PROTONIX) EC tablet 40 mg  40 mg Oral Q12H Johny Shock, MD   40 mg at 08/01/19 0805 ? rosuvastatin (CRESTOR) tablet 10 mg  10 mg Oral Daily Johny Shock, MD   10 mg at 08/01/19 0805 ? sodium chloride 0.9 % flush 3 mL  3 mL IV Push Q8H Johny Shock, MD   3 mL at 07/31/19 2151 Continuous Infusions: ? lactated Ringers 200 mL/hr (08/01/19 0242) PRN Medications: morphine (ADULT), ondansetron **OR** ondansetron (ZOFRAN) IV Push, polyethylene glycol, sodium chloride Dietary Orders (From admission, onward)     Start     Ordered  07/31/19 1300  Diet Fat Restricted  DIET EFFECTIVE NOW   Question Answer Comment Fat Restriction: 50 gm fat  Initiate Nutrition Management Protocol (Yes/No?) Yes - Initiate Protocol    07/31/19 1311    Objective: Vitals:Last 24 hours: Temp:  [97.9 ?F (36.6 ?C)-98.8 ?F (37.1 ?C)] 98.2 ?F (36.8 ?C)Pulse:  [79-91] 83Resp:  [18] 18BP: (100-127)/(50-67) 127/67SpO2:  [95 %-96 %] 96 %Wt: 07/31/19 60.2 kg 06/27/19 59.7 kg 05/16/19 61.2 kg I/O's:Gross Totals (Last 24 hours) at 08/01/2019 0836Last data filed at 08/01/2019 0620Intake 3050.01 ml Output -- Net 3050.01 ml PHYSICAL EXAM?	GENERAL:  Alert and oriented?	CARDIAC: RRR?	PULMONARY: Clear?	GASTROINTESTINAL: soft and mild tenderness epigastriumLabs:I have reviewed the patient's Laboratory Data within the last 24 hours.Recent Labs Lab 04/01/210540 04/02/210703 WBC 11.43* 8.76 HGB 13.4 10.9* HCT 40.6 33.2* MCV 90.6 91.2 PLT 363 264 Recent Labs Lab 04/01/210539 04/02/210703 NA 137 141 K 3.8 3.9 CO2 22 26 CL 107 110* BUN 11 7 CREATININE 0.67 0.58 Recent Labs Lab 04/01/210539 BILITOT 0.3 ALKPHOS 96 AST 9* ALT 20 LIPASE 1,452* No results for input(s): INR in the last 168 hours.Radiology :   No results found.Assessment/Plan: 44 yo woman with history of acute recurrent pancreatitis and psuedocyst, s/p recent EUS.  Normal pancreatic elastase.  Abdominal discomfort back to baseline.  Tolerating regular diet.  Had some neurologic symptoms, which have resolved.  Will stop IVF.  No objection to D/C from GI point of view.  Will sign off (follow up plan already in place).Signed:Gertha Lichtenberg Blair Dolphin, MD4/2/20218:36 AM

## 2019-08-01 NOTE — Plan of Care
Natasha English was discharged via Private Car accompanied by [employer].  Verbalized understanding of discharge instructionsand recommended follow up care as per the after visit summary.  Written discharge instructions provided. Denies any further questions. Vital signs    Vitals:  07/31/19 1114 07/31/19 1349 07/31/19 2148 08/01/19 0609 BP: (!) 100/50 117/67 114/62 127/67 Pulse:  (!) 91 79 83 Resp:  18 18 18  Temp:  98.8 ?F (37.1 ?C) 97.9 ?F (36.6 ?C) 98.2 ?F (36.8 ?C) TempSrc:  Oral Oral Oral SpO2:  95% 96% 96% Weight:  60.2 kg   Height:  5' 2 (1.575 m)   Plan of Care Overview/ Patient Status    Pt is A+Ox4, independent in room. Denies N/V. One dose of SQ morphine given this am for abd pain with positive effect. Tolerating low fat diet. Orthostatic VS negative, no complaints of dizziness. VSS. To f/u outpatient with PCP and GI.

## 2019-08-01 NOTE — Plan of Care
Problem: Adult Inpatient Plan of CareGoal: Readiness for Transition of CareOutcome: Outcome(s) achieved Plan of Care Overview/ Patient Status    CM Assessment and Discharge Planning  Admitted with dizziness, hx of recurrent pancreatitis-reports sobriety for more than a year, tox screen positive for benzos and cannabinoid. PMH of chronic pancreatitis, htn, hyperlipidemia, bipolar disorder, alcohol abuse. She lives home with s/o and adult children, she is independent with all ADLs and mobility. She is current with her pcp and denies issues with transportation or issues affording her medications. She declines the need to see social work or have VNA at home. Daughter to transport.   Most Recent Value Case Management Assessment Do you have a caregiver?  No Patient Requires Care Coordination Intervention Due To  none identified on admission Arrived from prior to admission  home Admitted from:  home Bed Hold   n/a Services Prior to Admission  none ADL Assistance  None Type of Home Care Services  None What equipment do you currently use at home?  none Documented Insurance Accurate  Yes Any financial concerns related to anticipated discharge needs  No Patient's home address verified  Yes Patient's PCP of record verified  Yes Last Date Seen by PCP  3-6 months Living Environment  Lives With  Child(ren), Adult, Significant other Current Living Arrangements  home/apartment/condo Home Accessibility  no concerns Transportation Available  car, family or friend will provide Living Arrangement Comments  lives home with a full household all adults Home Safety Feels Safe Living In Home  yes Source of Clinical History Patient's clinical history has been reviewed and source of Information is:  Patient CM/SW Attestation: Choose which ONE is appropriate for you I have reviewed the medical record and completed the above evaluation with the following recommendations.  Yes Discharge Planning Coordination Recommendations Discharge Planning Coordination Recommendations  Home with MD follow-up/No needs identified Discharge Planning Concerns to be Addressed  no discharge needs identified, denies needs/concerns at this time Concerns Comments  positive tox screen, history of etoh, I offered social work to see her, she declined Barriers to Discharge  no barriers identified Patient/Patient Representative was presented with a list of choices of facilities, agencies and/or dme providers  They had no preference Referral(s) placed for:  None Home Health Care Services Required  N/A Patient is considered homebound due to: (he/she requires considerable and taxing effort to leave their residence for medical reasons or religious services OR infrequently OR of short duration for other reasons)  n/a Equipment Needed After Discharge  none Mode of Transportation   Private car  (add comment for special considerations) Transportation scheduled for (date)   08/01/19 Transportation is scheduled as a will call?  no Patient to be accompanied by  daughter Is the patient discharging to their home address  Yes CM D/C Readiness PASRR completed and approved  N/A Authorization number obtained, if required  N/A Is there a 3 day INPATIENT Qualifying stay for Medicare Patients?  N/A Medicare IM- signed, dated, timed and scanned, if required  N/A DME Authorized/Delivered  N/A No needs identified/ follow up with PCP/MD  Yes Post acute care services secured W10 complete  N/A Pri Completed and Accepted   N/A Finalized Plan Expected Discharge Date  08/01/19 Discharge Disposition  Home or Self Care Post acute care services secured W10 complete  N/A Physician documentation required  AVS (Patient Instructions) Discharge Coordination/Progress  d/c home with family, declines social work consult or home health needs, daughter to transport. Mathis Dad RN Case  Management 6.2 [phone] [email or web address]

## 2019-08-12 ENCOUNTER — Telehealth: Admit: 2019-08-12 | Payer: PRIVATE HEALTH INSURANCE | Attending: Gastroenterology

## 2019-08-12 NOTE — Telephone Encounter
I have called and left a VM regarding scheduling MRI, I have instructed the patient to call the office back to set up appointment.Please call with any questions,Natasha Pember WigginsSchedulingAdvanced Endoscopy & Pancreatic DiseasesPhone- [phone]- [phone] 13, 2021  9:31 AM

## 2019-08-28 ENCOUNTER — Other Ambulatory Visit: Admit: 2019-08-28 | Payer: PRIVATE HEALTH INSURANCE

## 2019-08-28 DIAGNOSIS — E119 Type 2 diabetes mellitus without complications: Secondary | ICD-10-CM

## 2019-08-28 DIAGNOSIS — K859 Acute pancreatitis without necrosis or infection, unspecified: Secondary | ICD-10-CM

## 2019-08-28 DIAGNOSIS — E538 Deficiency of other specified B group vitamins: Secondary | ICD-10-CM

## 2019-08-28 DIAGNOSIS — D473 Essential (hemorrhagic) thrombocythemia: Secondary | ICD-10-CM

## 2019-08-28 LAB — COMPREHENSIVE METABOLIC PANEL
BKR ALANINE AMINOTRANSFERASE (ALT): 19 U/L (ref 13–56)
BKR ALBUMIN: 3.7 g/dL (ref 3.4–5.0)
BKR ALKALINE PHOSPHATASE: 90 U/L (ref 45–117)
BKR ANION GAP (LM): 6 mmol/L (ref 5–15)
BKR ASPARTATE AMINOTRANSFERASE (AST): 12 U/L — ABNORMAL LOW (ref 15–37)
BKR BILIRUBIN TOTAL: 0.1 mg/dL (ref <1.0)
BKR BLOOD UREA NITROGEN: 10 mg/dL (ref 7–18)
BKR CALCIUM: 9.4 mg/dL (ref 8.5–10.1)
BKR CHLORIDE: 106 mmol/L (ref 98–107)
BKR CO2: 26 mmol/L (ref 21–32)
BKR CREATININE: 0.71 mg/dL (ref 0.55–1.02)
BKR EGFR (AFR AMER) (LMC): >60 mL/min/{1.73_m2} (ref >60)
BKR EGFR (NON AFR AMER) (LMC): >60 mL/min/{1.73_m2} (ref >60)
BKR GLOBULIN: 3.7 g/dL (ref 2.5–5.0)
BKR GLUCOSE: 159 mg/dL — ABNORMAL HIGH (ref 65–110)
BKR POTASSIUM: 3.8 mmol/L (ref 3.5–5.1)
BKR PROTEIN TOTAL: 7.4 g/dL (ref 6.4–8.2)
BKR SODIUM: 138 mmol/L (ref 136–145)

## 2019-08-28 LAB — CBC WITH AUTO DIFFERENTIAL
BKR WAM ABSOLUTE IMMATURE GRANULOCYTES.: 0.04 x 1000/ÂµL (ref 0.00–0.10)
BKR WAM ABSOLUTE LYMPHOCYTE COUNT.: 2.95 x 1000/ÂµL (ref 1.00–4.50)
BKR WAM ABSOLUTE NEUTROPHIL COUNT.: 8.64 x 1000/ÂµL — ABNORMAL HIGH (ref 1.40–6.60)
BKR WAM BASOPHIL ABSOLUTE COUNT.: 0.09 x 1000/ÂµL (ref 0.0–0.3)
BKR WAM BASOPHILS: 0.7 % (ref 0.0–3.0)
BKR WAM EOSINOPHIL ABSOLUTE COUNT.: 0.24 x 1000/ÂµL (ref 0.00–0.45)
BKR WAM EOSINOPHILS: 1.9 % (ref 0.0–4.0)
BKR WAM HEMATOCRIT: 38.3 % (ref 34.0–45.0)
BKR WAM HEMOGLOBIN: 12.1 g/dL (ref 11.0–15.0)
BKR WAM IMMATURE GRANULOCYTES: 0.3 % (ref 0.0–1.0)
BKR WAM LYMPHOCYTES: 23.6 % — ABNORMAL LOW (ref 25.0–45.0)
BKR WAM MCH PG: 30 pg (ref 27–33)
BKR WAM MCHC: 31.6 g/dL — ABNORMAL LOW (ref 32.0–36.0)
BKR WAM MCV: 95 fL — ABNORMAL HIGH (ref 79.0–99.0)
BKR WAM MONOCYTE ABSOLUTE COUNT.: 0.54 x 1000/ÂµL (ref 0.00–1.20)
BKR WAM MONOCYTES: 4.3 % (ref 0.0–12.0)
BKR WAM MPV: 9.3 fL (ref 7.5–11.5)
BKR WAM NEUTROPHILS: 69.2 % — ABNORMAL HIGH (ref 36.0–66.0)
BKR WAM NUCLEATED RED BLOOD CELLS: 0 % — ABNORMAL LOW (ref 0.0–5.0)
BKR WAM PLATELETS: 336 x1000/ÂµL (ref 140–400)
BKR WAM RDW-CV: 14 % (ref 11.5–14.5)
BKR WAM RED BLOOD CELL COUNT.: 4.03 M/ÂµL (ref 4.00–5.20)
BKR WAM WHITE BLOOD CELL COUNT.: 12.5 x1000/ÂµL — ABNORMAL HIGH (ref 4.00–10.00)

## 2019-08-28 LAB — LIPASE: BKR LIPASE: 418 U/L — ABNORMAL HIGH (ref 73–393)

## 2019-08-28 LAB — LIPID PANEL
BKR CHOLESTEROL: 161 mg/dL
BKR HDL CHOLESTEROL: 44 mg/dL
BKR LDL CHOLESTEROL CALCULATED: 62 mg/dL
BKR TRIGLYCERIDES: 275 mg/dL — ABNORMAL HIGH

## 2019-08-28 LAB — ALBUMIN/CREATININE PANEL, URINE, RANDOM
BKR ALBUMIN, URINE, RANDOM: 5.1 mg/L (ref <30.0)
BKR CREATININE, URINE, RANDOM: <13 mg/dL — ABNORMAL HIGH (ref >5.4)
BKR MICROALBUMIN/CREATININE RATIO, URINE, RANDOM (LM): 62 mg/dL

## 2019-08-28 LAB — AMYLASE: BKR AMYLASE: 88 U/L (ref 25–115)

## 2019-08-28 LAB — HEMOGLOBIN A1C: BKR HEMOGLOBIN A1C: 6 % (ref 4.2–6.3)

## 2019-08-28 LAB — FOLATE: BKR FOLATE: 23.2 ng/mL (ref >5.4)

## 2019-09-01 ENCOUNTER — Inpatient Hospital Stay: Admit: 2019-09-01 | Discharge: 2019-09-01 | Payer: BLUE CROSS/BLUE SHIELD

## 2019-09-01 DIAGNOSIS — K852 Alcohol induced acute pancreatitis without necrosis or infection: Secondary | ICD-10-CM

## 2019-09-01 MED ORDER — GADOTERATE MEGLUMINE 0.5 MMOL/ML (376.9 MG/ML) INTRAVENOUS SOLUTION
0.5 mmol/mL (376.9 mg/mL) | Freq: Once | INTRAVENOUS | Status: CP | PRN
Start: 2019-09-01 — End: ?
  Administered 2019-09-01: 22:00:00 0.5 mL via INTRAVENOUS

## 2019-09-02 ENCOUNTER — Ambulatory Visit: Admit: 2019-09-02 | Payer: PRIVATE HEALTH INSURANCE | Attending: Family

## 2019-09-02 DIAGNOSIS — K863 Pseudocyst of pancreas: Secondary | ICD-10-CM

## 2019-09-02 NOTE — Progress Notes
VIDEO TELEHEALTH VISIT: This clinician is part of the telehealth program and is conducting this visit in a currently approved location. For this visit the clinician and patient were present via interactive audio & video telecommunications system that permits real-time communications.Patient/parent or guardian consent given for video visit: Yes State patient is located in: [place] clinician is appropriately licensed in the above state to provide care for this visit. Other individuals present during the telehealth encounter and their role/relation: noneBecause this visit was completed over video, a hands-on physical exam was not performed.  Patient/parent or guardian understands and knows to call back if condition changes.[facility]Phone: [phone]: [phone]:   ICD-10-CM  1. Pseudocyst of pancreas  K86.3  2. Chronic pancreatitis, unspecified pancreatitis type (HC Code)  K86.1   HPI: Natasha English is a 44 y.o. female being interviewed today due to 1. Pseudocyst of pancreas   2. Chronic pancreatitis, unspecified pancreatitis type (HC Code)    I have reviewed the patient's referral records, including past medical history, past physical exams, pertinent labs, imaging and prior plans of care.  Pt is seen in f/u today for chronic pancreatitis w pseudocyst.  She had MRI yesterday.  She is feeling well.  She had another admission for exacerbation in early April.  No current abdominal pain, no nausea or vomiting.  She has lost 25lbs in the last 6 months without trying.  Her stools are not soft or greasy.  She is no longer on creon.Medical History: PMH PSH Past Medical History: Diagnosis Date ? Encounter for blood transfusion   during 2nd pregnancy ? Essential hypertension 10/12/2009 ? High cholesterol  ? History of abnormal cervical Papanicolaou smear  ? Pancreatic cyst 07/2018 ? Pancreatitis  ? Paroxysmal ventricular tachycardia (HC Code) 10/12/2009  RT OUTFLOW TRACT/ ablations x 6 ? Pulmonary embolism (HC Code) 2008  no blood thinners at this time ? Seizures (HC Code)   during pregnancy  Past Surgical History: Procedure Laterality Date ? CARDIAC ELECTROPHYSIOLOGY MAPPING AND ABLATION  04/23/2008  [place] ? CERVICAL CONE BIOPSY   ? CESAREAN SECTION   ? MIBI Exercise stress/test (NM)  10/08/2007  [facility] ? MOUTH SURGERY   ? TONSILLECTOMY   ? TUBAL LIGATION   ? UPPER GASTROINTESTINAL ENDOSCOPY    Social History Family History Social History Tobacco Use ? Smoking status: Current Some Day Smoker   Packs/day: 0.50   Types: Cigarettes ? Smokeless tobacco: Never Used Substance Use Topics ? Alcohol use: Not Currently   Comment: daily 2-3 drinks/day  Family History Problem Relation Age of Onset ? Heart disease Father       Myocardial Infarction ? No Known Problems Mother  ? Breast cancer Maternal Grandmother   Medications Current Outpatient Medications (CARDIAC DRUGS) Medication Sig ? amLODIPine Take 1 tablet (10 mg total) by mouth daily. Hold for blood pressure below 120 systolic Current Outpatient Medications (CARDIOVASCULAR) Medication Sig ? atorvastatin Take 20 mg by mouth daily.. Current Outpatient Medications (CNS DRUGS) Medication Sig ? lamoTRIgine Take 200 mg by mouth daily.  Current Outpatient Medications (PSYCHOTHERAPEUTIC DRUGS) Medication Sig ? buPROPion SR Take 300 mg by mouth daily.  Current Outpatient Medications (GASTROINTESTINAL) Medication Sig ? ondansetron Place 4 mg onto the tongue every 8 (eight) hours as needed for nausea. ? pantoprazole Take 1 tablet (40 mg total) by mouth every 12 (twelve) hours. (Patient taking differently: Take 40 mg by mouth daily. ) ? Vascepa TAKE 2 TABLETS BY MOUTH TWICE A DAY (Patient taking differently: Take  2 g by mouth 2 (two) times daily. ) Current Outpatient Medications (PRE-NATAL VITAMINS) Medication Sig ? Prenatal Vitamin Plus Low Iron Take 1 tablet by mouth daily. *OTC   Allergies Allergies Allergen Reactions ? Lisinopril Swelling   Angioedema on May 11, 2018 ? Hydrochlorothiazide Other (See Comments)   Electrolyte imbalance  Family History: non-contributoryReview of Systems: ROS:   No SOB or CPNo fever, weight lossNo abdominal painNo nausea or vomitingNo diarrhea or constipationNo rashNo arthralgias or myalgiasNo behavioral issuesNo headache or weaknessObjective: Limited physical exam was performed due to telehealth visit. There were no vitals filed for this visit.  Physical Exam:Gen: pleasant in NADRespirations: normal effortNeuro: AxOx3 Skin shows no signs of jaundiceLabs:Lipase Date Value Ref Range Status 08/28/2019 418 (H) 73 - 393 U/L Final Total Bilirubin Date Value Ref Range Status 08/28/2019 0.1 <1.0 mg/dL Final   Comment:   Use of this assay is not recommended for patients undergoing treatment with Eltrombopag due to the potential for falsely elevated results.  07/31/2019 0.3 <1.0 mg/dL Final   Comment:   Use of this assay is not recommended for patients undergoing treatment with Eltrombopag due to the potential for falsely elevated results.  05/17/2019 0.3 <1.0 mg/dL Final   Comment:   Use of this assay is not recommended for patients undergoing treatment with Eltrombopag due to the potential for falsely elevated results.  05/16/2019 0.2 <1.0 mg/dL Final   Comment:   Use of this assay is not recommended for patients undergoing treatment with Eltrombopag due to the potential for falsely elevated results.  Bilirubin, Direct Date Value Ref Range Status 05/06/2018 0.09 0.00 - 0.20 mg/dL Final 09/81/1914 7.82 9.56 - 0.20 mg/dL Final 21/30/8657 8.46 9.62 - 0.20 mg/dL Final 95/28/4132 4.40 1.02 - 0.20 mg/dL Final Aspartate Aminotransferase (AST) Date Value Ref Range Status 08/28/2019 12 (L) 15 - 37 U/L Final 07/31/2019 9 (L) 15 - 37 U/L Final 05/17/2019 15 15 - 37 U/L Final 05/16/2019 9 (L) 15 - 37 U/L Final Alanine Aminotransferase (ALT) Date Value Ref Range Status 08/28/2019 19 13 - 56 U/L Final 07/31/2019 20 13 - 56 U/L Final 05/17/2019 18 13 - 56 U/L Final 05/16/2019 14 13 - 56 U/L Final Alkaline Phosphatase Date Value Ref Range Status 08/28/2019 90 45 - 117 U/L Final 07/31/2019 96 45 - 117 U/L Final 05/17/2019 80 45 - 117 U/L Final 05/16/2019 87 45 - 117 U/L Final GGT Date Value Ref Range Status 08/03/2018 40 5 - 55 U/L Final 01/07/2018 396 (H) 5 - 55 U/L Final Albumin Date Value Ref Range Status 08/28/2019 3.7 3.4 - 5.0 g/dL Final 72/53/6644 4.4 3.4 - 5.0 g/dL Final 03/47/4259 3.8 3.4 - 5.0 g/dL Final 56/38/7564 4.3 3.4 - 5.0 g/dL Final  No results found for: IGGResults for orders placed or performed in visit on 08/28/19 Hemoglobin A1c Result Value Ref Range  Hemoglobin A1c 6.0 4.2 - 6.3 % Lipid panel Result Value Ref Range  Cholesterol 161 See Comment mg/dL  HDL 44 See Comment mg/dL  Triglycerides 332 (H) See Comment mg/dL  LDL Calculated 62 See Comment mg/dL Albumin/creatinine panel, urine, random Result Value Ref Range  Albumin, Urine, Random 5.1 <30.0 mg/L  Creatinine, Urine, Random <13 No reference range established mg/dL  Microalbumin/Creatinine Ratio, Urine, Random   Folate Result Value Ref Range  Folate 23.2 >5.4 ng/mL Amylase Result Value Ref Range  Amylase 88 25 - 115 U/L Lipase Result Value Ref Range  Lipase 418 (H) 73 - 393 U/L Comprehensive metabolic panel Result Value  Ref Range  Sodium 138 136 - 145 mmol/L  Potassium 3.8 3.5 - 5.1 mmol/L  Chloride 106 98 - 107 mmol/L  CO2 26 21 - 32 mmol/L  Anion Gap 6 5 - 15 mmol/L  Glucose 159 (H) 65 - 110 mg/dL  BUN 10 7 - 18 mg/dL  Creatinine 5.40 9.81 - 1.02 mg/dL  eGFR (AFRICAN-AMERICAN) >60 >60 mL/min/1.52m2  eGFR (NON African-American) >60 >60 mL/min/1.15m2  Calcium 9.4 8.5 - 10.1 mg/dL  Total Protein 7.4 6.4 - 8.2 g/dL  Albumin 3.7 3.4 - 5.0 g/dL  Globulin 3.7 2.5 - 5.0 g/dL  Total Bilirubin 0.1 <1.9 mg/dL  Alkaline Phosphatase 90 45 - 117 U/L  Alanine Aminotransferase (ALT) 19 13 - 56 U/L  Aspartate Aminotransferase (AST) 12 (L) 15 - 37 U/L CBC auto differential Result Value Ref Range  WBC 12.50 (H) 4.00 - 10.00 x1000/?L  RBC 4.03 4.00 - 5.20 M/?L  Hemoglobin 12.1 11.0 - 15.0 g/dL  Hematocrit 14.7 82.9 - 45.0 %  MCV 95.0 79.0 - 99.0 fL  MCH 30.0 27 - 33 pg  MCHC 31.6 (L) 32.0 - 36.0 g/dL  RDW-CV 56.2 13.0 - 86.5 %  Platelets 336 140 - 400 x1000/?L  MPV 9.3 7.5 - 11.5 fL  Neutrophils 69.2 (H) 36.0 - 66.0 %  Lymphocytes 23.6 (L) 25.0 - 45.0 %  Monocytes 4.3 0.0 - 12.0 %  Eosinophils 1.9 0.0 - 4.0 %  Basophil 0.7 0.0 - 3.0 %  Immature Granulocytes 0.3 0.0 - 1.0 %  nRBC 0.0 0.0 - 5.0 %  ANC (Abs Neutrophil Count) 8.64 (H) 1.40 - 6.60 x 1000/?L  Absolute Lymphocyte Count 2.95 1.00 - 4.50 x 1000/?L  Monocyte Absolute Count 0.54 0.00 - 1.20 x 1000/?L  Eosinophil Absolute Count 0.24 0.00 - 0.45 x 1000/?L  Basophil Absolute Count 0.09 0.0 - 0.3 x 1000/?L  Absolute Immature Granulocyte Count 0.04 0.00 - 0.10 x 1000/?L Imaging:MRI ABDOMEN W WO IV CONTRAST MRCP?Clinical indications: Pancreatic cyst/pseudocyst dilated PD 7mm?Comparison: [place] dated April 16, 2019.?TECHNIQUE: Multiplanar, multi sequential MR imaging of the abdomen was obtained with and without intravenous contrast. 12 cc of intravenous Dotarem was given. Coronal 3-D MRCP images were obtained?FINDINGS:?Irregular dilatation of the main pancreatic duct and pancreatic side are redemonstrated, with the main pancreatic duct measuring up to 0.7 cm, compatible with chronic pancreatitis. Previously seen superimposed findings of acute pancreatitis have resolved. On image 51, series [id number], there is a 1.6 x 1.2 cm cystic lesion associated with the pancreatic head, presumably a pseudocyst, similar to the prior exam. No new collections are identified.?The common bile duct remains mildly dilated measuring up to 7 mm, with trace intrahepatic biliary prominence again noted. The spleen, kidneys, adrenal glands, liver and gallbladder are unremarkable with the exception of a right renal cyst. There is no abdominal adenopathy or ascites. The hepatic vasculature is patent.?   IMPRESSION:?Stable collection associated with the pancreatic head has described.?Reported And Signed By: Liliane Channel, MDAssessment: Natasha English is a 44 y.o. female being interviewed today for 1. Pseudocyst of pancreas   2. Chronic pancreatitis, unspecified pancreatitis type (HC Code)   Plan:   ICD-10-CM  1. Pseudocyst of pancreas  K86.3  2. Chronic pancreatitis, unspecified pancreatitis type (HC Code)  K86.1   -Recommendations:Reviewed symptoms and  imaging with pt.  Cyst is stable currently and patient with no abdominal symptoms.  She has lost some weight but is feeling overall well.  MRI shows stable chronic pancreatitis.  Recommend we follow up with the  pt in 3 months (August 2021) via telehealth.  If she continues to lose weight, may consider restarting creon for further imaging.* I will ask our scheduling team to set up telehealth visit in 3 months (August 2021) with me to review symptoms, weight loss and overall plan.-Orders placed during this visit:telehealth f/u in 3 mo Thank you for the kind referral.  We greatly appreciate the opportunity to assist in your patient's care.  If there are further questions, please do not hesitate to contact us.Best Regards,Electronically Signed by Kevyn Wengert [place] Lamarcus Spira, APRN, Sep 02, 2019 9:43 [facility] Yulissa Needham APRN MSN/MPHNurse Practitioner Clinical CoordinatorAdvanced Endoscopy & Pancreatic DiseasesDigestive [facility] School of MedicineTel: [phone] Fax: [phone]

## 2019-09-03 DIAGNOSIS — K861 Other chronic pancreatitis: Secondary | ICD-10-CM

## 2019-10-13 ENCOUNTER — Ambulatory Visit: Admit: 2019-10-13 | Payer: PRIVATE HEALTH INSURANCE | Attending: Family

## 2019-10-19 ENCOUNTER — Inpatient Hospital Stay: Admit: 2019-10-19 | Payer: PRIVATE HEALTH INSURANCE

## 2019-10-19 ENCOUNTER — Inpatient Hospital Stay
Admit: 2019-10-19 | Discharge: 2019-10-20 | Payer: BLUE CROSS/BLUE SHIELD | Source: Home / Self Care | Admitting: Internal Medicine

## 2019-10-19 ENCOUNTER — Encounter: Admit: 2019-10-19 | Payer: PRIVATE HEALTH INSURANCE

## 2019-10-19 DIAGNOSIS — E7800 Pure hypercholesterolemia, unspecified: Secondary | ICD-10-CM

## 2019-10-19 DIAGNOSIS — K859 Acute pancreatitis without necrosis or infection, unspecified: Secondary | ICD-10-CM

## 2019-10-19 DIAGNOSIS — Z8742 Personal history of other diseases of the female genital tract: Secondary | ICD-10-CM

## 2019-10-19 DIAGNOSIS — K862 Cyst of pancreas: Secondary | ICD-10-CM

## 2019-10-19 DIAGNOSIS — Z5189 Encounter for other specified aftercare: Secondary | ICD-10-CM

## 2019-10-19 DIAGNOSIS — R569 Unspecified convulsions: Secondary | ICD-10-CM

## 2019-10-19 DIAGNOSIS — I4729 Paroxysmal ventricular tachycardia (HC Code): Secondary | ICD-10-CM

## 2019-10-19 DIAGNOSIS — I10 Essential (primary) hypertension: Secondary | ICD-10-CM

## 2019-10-19 DIAGNOSIS — I2699 Other pulmonary embolism without acute cor pulmonale: Secondary | ICD-10-CM

## 2019-10-19 DIAGNOSIS — R109 Unspecified abdominal pain: Secondary | ICD-10-CM

## 2019-10-19 LAB — CBC WITH AUTO DIFFERENTIAL
BKR WAM ABSOLUTE IMMATURE GRANULOCYTES.: 0.05 x 1000/ÂµL (ref 0.00–0.10)
BKR WAM ABSOLUTE LYMPHOCYTE COUNT.: 3.12 x 1000/ÂµL (ref 1.00–4.50)
BKR WAM ABSOLUTE NEUTROPHIL COUNT.: 12.14 x 1000/ÂµL — ABNORMAL HIGH (ref 1.40–6.60)
BKR WAM BASOPHIL ABSOLUTE COUNT.: 0.09 x 1000/ÂµL (ref 0.0–0.3)
BKR WAM BASOPHILS: 0.5 % (ref 0.0–3.0)
BKR WAM EOSINOPHIL ABSOLUTE COUNT.: 0.22 x 1000/ÂµL (ref 0.00–0.45)
BKR WAM EOSINOPHILS: 1.3 % (ref 0.0–4.0)
BKR WAM HEMATOCRIT: 35.8 % (ref 34.0–45.0)
BKR WAM HEMOGLOBIN: 12.1 g/dL (ref 11.0–15.0)
BKR WAM IMMATURE GRANULOCYTES: 0.3 % (ref 0.0–1.0)
BKR WAM LYMPHOCYTES: 18.9 % — ABNORMAL LOW (ref 25.0–45.0)
BKR WAM MCH PG: 30.1 pg (ref 27–33)
BKR WAM MCHC: 33.8 g/dL (ref 32.0–36.0)
BKR WAM MCV: 89.1 fL (ref 79.0–99.0)
BKR WAM MONOCYTE ABSOLUTE COUNT.: 0.89 x 1000/ÂµL (ref 0.00–1.20)
BKR WAM MONOCYTES: 5.4 % (ref 0.0–12.0)
BKR WAM MPV: 8.9 fL (ref 7.5–11.5)
BKR WAM NEUTROPHILS: 73.6 % — ABNORMAL HIGH (ref 36.0–66.0)
BKR WAM NUCLEATED RED BLOOD CELLS: 0 % (ref 0.0–5.0)
BKR WAM PLATELETS: 350 x1000/ÂµL (ref 140–400)
BKR WAM RDW-CV: 14 % (ref 11.5–14.5)
BKR WAM RED BLOOD CELL COUNT.: 4.02 M/ÂµL (ref 4.00–5.20)
BKR WAM WHITE BLOOD CELL COUNT.: 16.51 x1000/ÂµL — ABNORMAL HIGH (ref 4.00–10.00)

## 2019-10-19 LAB — HEPATIC FUNCTION PANEL
BKR ALANINE AMINOTRANSFERASE (ALT): 18 U/L (ref 13–56)
BKR ALBUMIN: 4 g/dL (ref 3.4–5.0)
BKR ALKALINE PHOSPHATASE: 77 U/L (ref 45–117)
BKR ASPARTATE AMINOTRANSFERASE (AST): 12 U/L — ABNORMAL LOW (ref 15–37)
BKR BILIRUBIN DIRECT (LMC LM): 0.07 mg/dL (ref 0.00–0.20)
BKR BILIRUBIN TOTAL: 0.2 mg/dL (ref <1.0)
BKR GLOBULIN: 3.6 g/dL (ref 2.5–5.0)
BKR PROTEIN TOTAL: 7.6 g/dL (ref 6.4–8.2)

## 2019-10-19 LAB — URINALYSIS WITH CULTURE REFLEX      (BH LMW YH)
BKR BILIRUBIN, UA MG/DL: NEGATIVE mg/dL
BKR BLOOD, UA: NEGATIVE
BKR GLUCOSE, UA MG/DL: NEGATIVE mg/dL
BKR KETONES, UA MG/DL: NEGATIVE mg/dL
BKR LEUKOCYTE ESTERASE, UA: NEGATIVE Leu/uL
BKR NITRITE, UA: NEGATIVE
BKR PH, UA: 8 — ABNORMAL HIGH (ref 5.0–7.0)
BKR PROTEIN, UA.: NEGATIVE mg/dL
BKR RBC/HPF INSTRUMENT: 1 /HPF (ref 0–3)
BKR SPECIFIC GRAVITY, UA: 1.008 (ref 1.003–1.035)
BKR URINE SQUAMOUS EPITHELIAL CELLS, UA (NUMERIC): 2 /HPF (ref 0–5)
BKR UROBILINOGEN, UA - ALPHA: NEGATIVE EU/dL

## 2019-10-19 LAB — BASIC METABOLIC PANEL
BKR ANION GAP (LM): 5 mmol/L (ref 5–15)
BKR BLOOD UREA NITROGEN: 8 mg/dL (ref 7–18)
BKR CALCIUM: 9.2 mg/dL (ref 8.5–10.1)
BKR CHLORIDE: 106 mmol/L (ref 98–107)
BKR CO2: 27 mmol/L (ref 21–32)
BKR CREATININE: 0.67 mg/dL (ref 0.55–1.02)
BKR EGFR (AFR AMER) (LMC): >60 mL/min/{1.73_m2} (ref >60)
BKR EGFR (NON AFR AMER) (LMC): >60 mL/min/{1.73_m2} (ref >60)
BKR GLUCOSE: 115 mg/dL — ABNORMAL HIGH (ref 65–110)
BKR POTASSIUM: 3.3 mmol/L — ABNORMAL LOW (ref 3.5–5.1)
BKR SODIUM: 138 mmol/L (ref 136–145)

## 2019-10-19 LAB — SARS COV-2 (COVID-19) RNA: BKR SARS-COV-2 RNA (COVID-19) (YH): NEGATIVE

## 2019-10-19 LAB — LIPASE: BKR LIPASE: 13286 U/L — ABNORMAL HIGH (ref 73–393)

## 2019-10-19 LAB — UA REFLEX CULTURE

## 2019-10-19 LAB — HCG, SERUM, QUALITATIVE (BH GH L LMW): BKR SERUM PREGNANCY-QUALITATIVE: NEGATIVE

## 2019-10-19 MED ORDER — IOHEXOL 350 MG IODINE/ML INTRAVENOUS SOLUTION
350 mg iodine/mL | Freq: Once | INTRAVENOUS | Status: CP | PRN
Start: 2019-10-19 — End: ?
  Administered 2019-10-19: 18:00:00 350 mL via INTRAVENOUS

## 2019-10-19 MED ORDER — ONDANSETRON 4 MG DISINTEGRATING TABLET
4 mg | Freq: Four times a day (QID) | ORAL | Status: DC | PRN
Start: 2019-10-19 — End: 2019-10-21

## 2019-10-19 MED ORDER — SODIUM CHLORIDE 0.9 % (FLUSH) INJECTION SYRINGE
0.9 % | INTRAVENOUS | Status: DC | PRN
Start: 2019-10-19 — End: 2019-10-21

## 2019-10-19 MED ORDER — PROCHLORPERAZINE MALEATE 10 MG TABLET
10 mg | Freq: Four times a day (QID) | ORAL | Status: DC | PRN
Start: 2019-10-19 — End: 2019-10-21
  Administered 2019-10-19 – 2019-10-20 (×2): 10 mg via ORAL

## 2019-10-19 MED ORDER — MORPHINE 4 MG/ML INTRAVENOUS SOLUTION
4 mg/mL | Freq: Once | SUBCUTANEOUS | Status: CP
Start: 2019-10-19 — End: ?
  Administered 2019-10-19: 17:00:00 4 mL via SUBCUTANEOUS

## 2019-10-19 MED ORDER — MORPHINE 2 MG/ML INJECTION SYRINGE
2 mg/mL | SUBCUTANEOUS | Status: DC | PRN
Start: 2019-10-19 — End: 2019-10-21
  Administered 2019-10-19 – 2019-10-20 (×5): 2 mL via SUBCUTANEOUS

## 2019-10-19 MED ORDER — ONDANSETRON HCL (PF) 4 MG/2 ML INJECTION SOLUTION
4 mg/2 mL | Freq: Four times a day (QID) | INTRAVENOUS | Status: DC | PRN
Start: 2019-10-19 — End: 2019-10-21
  Administered 2019-10-19 – 2019-10-20 (×3): 4 mL via INTRAVENOUS

## 2019-10-19 MED ORDER — OXYCODONE IMMEDIATE RELEASE 5 MG TABLET
5 mg | ORAL | Status: DC | PRN
Start: 2019-10-19 — End: 2019-10-21
  Administered 2019-10-19 – 2019-10-20 (×5): 5 mg via ORAL

## 2019-10-19 MED ORDER — ROSUVASTATIN 10 MG TABLET
10 mg | Freq: Every day | ORAL | 1 refills | Status: DC
Start: 2019-10-19 — End: 2019-10-21
  Administered 2019-10-20: 12:00:00 10 mg via ORAL

## 2019-10-19 MED ORDER — SODIUM CHLORIDE 0.9 % BOLUS (NEW BAG)
0.9 % | INTRAVENOUS | Status: CP
Start: 2019-10-19 — End: ?
  Administered 2019-10-19: 18:00:00 0.9 mL/h via INTRAVENOUS

## 2019-10-19 MED ORDER — BUPROPION HCL XL 300 MG 24 HR TABLET, EXTENDED RELEASE
300 mg | Freq: Every day | ORAL | Status: DC
Start: 2019-10-19 — End: 2019-10-21
  Administered 2019-10-20: 12:00:00 300 mg via ORAL

## 2019-10-19 MED ORDER — ONDANSETRON HCL (PF) 4 MG/2 ML INJECTION SOLUTION
4 mg/2 mL | Freq: Once | INTRAVENOUS | Status: CP
Start: 2019-10-19 — End: ?
  Administered 2019-10-19: 16:00:00 4 mL via INTRAVENOUS

## 2019-10-19 MED ORDER — LAMOTRIGINE IMMEDIATE RELEASE 100 MG TABLET
100 mg | Freq: Every day | ORAL | Status: DC
Start: 2019-10-19 — End: 2019-10-21
  Administered 2019-10-20: 12:00:00 100 mg via ORAL

## 2019-10-19 MED ORDER — POTASSIUM CHLORIDE ER 20 MEQ TABLET,EXTENDED RELEASE(PART/CRYST)
20 MEQ | ORAL | Status: DC | PRN
Start: 2019-10-19 — End: 2019-10-21
  Administered 2019-10-19 (×2): 20 MEQ via ORAL

## 2019-10-19 MED ORDER — SODIUM CHLORIDE 0.9 % INTRAVENOUS SOLUTION
INTRAVENOUS | Status: DC
Start: 2019-10-19 — End: 2019-10-21
  Administered 2019-10-19 – 2019-10-20 (×4): via INTRAVENOUS

## 2019-10-19 MED ORDER — SODIUM CHLORIDE 0.9 % (FLUSH) INJECTION SYRINGE
0.9 % | Freq: Three times a day (TID) | INTRAVENOUS | Status: DC
Start: 2019-10-19 — End: 2019-10-21

## 2019-10-19 MED ORDER — SODIUM CHLORIDE 0.9 % BOLUS (NEW BAG)
0.9 % | INTRAVENOUS | Status: CP
Start: 2019-10-19 — End: ?
  Administered 2019-10-19: 16:00:00 0.9 mL/h via INTRAVENOUS

## 2019-10-19 MED ORDER — PROCHLORPERAZINE 25 MG RECTAL SUPPOSITORY
25 mg | Freq: Two times a day (BID) | RECTAL | Status: DC | PRN
Start: 2019-10-19 — End: 2019-10-21

## 2019-10-19 MED ORDER — MORPHINE 4 MG/ML INTRAVENOUS SOLUTION
4 mg/mL | Freq: Once | SUBCUTANEOUS | Status: CP
Start: 2019-10-19 — End: ?
  Administered 2019-10-19: 16:00:00 4 mL via SUBCUTANEOUS

## 2019-10-19 MED ORDER — ACETAMINOPHEN 325 MG TABLET
325 mg | Freq: Four times a day (QID) | ORAL | Status: DC | PRN
Start: 2019-10-19 — End: 2019-10-21

## 2019-10-19 MED ORDER — FAMOTIDINE 20 MG TABLET
20 mg | Freq: Two times a day (BID) | ORAL | Status: DC
Start: 2019-10-19 — End: 2019-10-21
  Administered 2019-10-20 (×2): 20 mg via ORAL

## 2019-10-19 NOTE — Other
PHARMACY-ASSISTED MEDICATION REPORTPharmacist review of the best possible medication history obtained by the pharmacy medication history technician has been performed.  I have updated the home medication list and identified the following information that may be relevant to this admission.NOTES/RECOMMENDATIONS Per MHT, patient  provided good medication history including exact dose/frequency during interviewFollowing resources were utilized to confirm the list Pharmacy record - Epic Surescripts DatabasePatient may not be taking Vascepa consistently or as prescribed. Last fill date was 06/23/18 #120 tabsPer MHT, patient reported that she is no longer taking amlodipine. Patient stated that this medication has been discontinued by her providerPatient confirmed she is currently taking pantoprazole 40 mg BID as opposed to the 40 mg daily as prescribed No current facility-administered medications on file prior to encounter.  Current Outpatient Medications on File Prior to Encounter Medication Sig Note Last Dose  [DISCONTINUED] amLODIPine (NORVASC) 10 mg tablet Take 1 tablet (10 mg total) by mouth daily. Hold for blood pressure below 120 systolic (Patient not taking: Reported on 10/19/2019) 10/19/2019: Med[employer]x Tech([place], CPHT): FLAG FOR REMOVAL -  patient's LIP had instructed to discontinue the medication   Not Taking at Unknown time  atorvastatin (LIPITOR) 20 MG tablet Take 20 mg by mouth daily.Marland Kitchen    buPROPion SR (WELLBUTRIN SR) 150 mg 12 hr tablet Take 150 mg by mouth 2 (two) times daily.     lamoTRIgine (LAMICTAL) 200 mg immediate release tablet Take 200 mg by mouth daily.     ondansetron (ZOFRAN-ODT) 4 mg disintegrating tablet Place 4 mg onto the tongue every 8 (eight) hours as needed for nausea. 07/31/2019: Med[employer] Tech(Josephine Scarpa, CPHT):  Patient using as PRN pantoprazole (PROTONIX) 40 mg tablet Take 1 tablet (40 mg total) by mouth every 12 (twelve) hours.    PRENATAL VITAMIN PLUS LOW IRON 27 mg iron- 1 mg Tab tablet Take 1 tablet by mouth daily. *OTC    VASCEPA 1 gram capsule TAKE 2 TABLETS BY MOUTH TWICE A DAY (Patient taking differently: Take 2 g by mouth 2 (two) times daily. )   Thank Colleen Can, PharmD6/20/20212:44 PMPhone: [phone]

## 2019-10-19 NOTE — Utilization Review (ED)
UM Status: Commercial - IP Tess Scientist, research (medical), BSN, ACMLawrence + Silver Cliff HospitalER Case [phone]

## 2019-10-19 NOTE — ED Notes
10:59 AM Report rec from day shift11:53 AMPt alert, oriented to person place year and birthdatePt c/o bilat upper abd pain 8/10 intermittent pain since FridayC/o n/v, occ diarrheaPt aware of need for urine1:28 PMPt to be admittedFirst dose of K replacement given-see MARBelongings:  Pants top, shoes, lunch box, phone, waterbottle1:46 PMFloor Handoff Telemetry: 	[]  Yes		[x]  NoCode Status:   [x]  Full		[]  DNR		[]  DNI		Other (specify):Safety Precautions: [x]  None	[]  Sitter   []  Restraints	[]  Suicidal	[]  Fall Risk	Other (specify):4Mentation/Orientation:	 A&O (Self, person, place, time) x       4   	 Disoriented to:                    	 Deficits: []  Hearing impaired	[]  Blind  []  Nonverbal	 []  Mental retardationOxygenation Upon Admission: [x]  RA	[]  NC	[]  Venti  []  Simple Mask []  Other	Baseline O2 Status? [x]  Yes	[]  NoAmbulation: [x]  Independent	[]  Cane   []  Walker	[]  Wheelchair	[]  Bedbound		[]  Hemiplegic	[]  Paraplegic	[]  QuadraplegicEliminiation: [x]  Independent	[]  Commode	[]  Bedpan/Urinal  []  Straight Cath []  Foley cath			[]  Urostomy	[]  Colostomy	Other (specify):Diarrhea/Loose stool : [x]  1x within 24h  []  2x within 24h  []  3x within 24h  []  None 	C.Diff Order: 	[]  Ordered- needs to be collected             []  Collected-sent to lab             []  Resulted - Negative C.Diff             []  Resulted - Positive C.Diff[]  Not Ordered   []  N/ASkin Alteration: []  Pressure Injury []  Wound [x]  None []  Skin not assessedDiet: []  Regular/No order placed	[x]  NPO		Other (specify):IV Access: [x]  PIV   []  PICC    []  Port    [] Central line    []  A-line    Other (specify)IVF/GTT Running Upon ED Departure? []  No	    [x]  Yes (specify):Outstanding Meds/Treatments/Tests:Patient Belongings:Are the belongings documented?          []  No	    [x]  YesIs someone taking belongings home?   [x]  No     []  Yes  Who? (specify) ED RN and Contact number/MHB #:               Iona Hansen a

## 2019-10-19 NOTE — ED Provider Notes
HistoryChief Complaint Patient presents with ? Abdominal Pain   abdominal pain since Friday, eased up Friday afternoon and now since this morning has been constant. + Nausea + diarhea x 2 days.   Patient presents with pancreatitis patient.  She states that she has chronic pancreatitis.  She has recently had an for the same.  She has had transesophageal ultrasound also.  She has no fever chills.  She states that this is her typical pancreatitis flare.  She something several days ago that inflamed her pancreas.The history is provided by the patient. Abdominal PainPain location:  EpigastricPain quality: sharp  Pain radiates to:  Does not radiatePain severity:  SevereOnset quality:  Unable to specifyTiming:  ConstantProgression:  WorseningChronicity:  ChronicContext: not alcohol use  Relieved by:  NothingWorsened by:  NothingIneffective treatments:  None triedAssociated symptoms: nausea  Associated symptoms: no chills, no constipation, no cough, no diarrhea, no dysuria, no fatigue, no fever, no shortness of breath, no sore throat and no vomiting   Past Medical History: Diagnosis Date ? Encounter for blood transfusion   during 2nd pregnancy ? Essential hypertension 10/12/2009 ? High cholesterol  ? History of abnormal cervical Papanicolaou smear  ? Pancreatic cyst 07/2018 ? Pancreatitis  ? Paroxysmal ventricular tachycardia (HC Code) 10/12/2009  RT OUTFLOW TRACT/ ablations x 6 ? Pulmonary embolism (HC Code) 2008  no blood thinners at this time ? Seizures (HC Code)   during pregnancy Past Surgical History: Procedure Laterality Date ? CARDIAC ELECTROPHYSIOLOGY MAPPING AND ABLATION  04/23/2008  [place] ? CERVICAL CONE BIOPSY   ? CESAREAN SECTION   ? MIBI Exercise stress/test (NM)  10/08/2007  L&M ? MOUTH SURGERY   ? TONSILLECTOMY   ? TUBAL LIGATION   ? UPPER GASTROINTESTINAL ENDOSCOPY   Family History Problem Relation Age of Onset ? Heart disease Father       Myocardial Infarction ? No Known Problems Mother  ? Breast cancer Maternal Grandmother  Social History Socioeconomic History ? Marital status: Single   Spouse name: Not on file ? Number of children: Not on file ? Years of education: Not on file ? Highest education level: Not on file Tobacco Use ? Smoking status: Current Some Day Smoker   Packs/day: 0.50   Types: Cigarettes ? Smokeless tobacco: Never Used Substance and Sexual Activity ? Alcohol use: Not Currently   Comment: daily 2-3 drinks/day ? Drug use: Yes   Frequency: 7.0 times per week   Types: Marijuana ? Sexual activity: Yes ED Other Social History ? E-cigarette status Never User  ? E-Cigarette Use Never User  E-cigarette/Vaping Substances E-cigarette/Vaping Devices Review of Systems Constitutional: Negative for chills, fatigue and fever. HENT: Negative for sore throat.  Respiratory: Negative for cough and shortness of breath.  Gastrointestinal: Positive for abdominal pain and nausea. Negative for constipation, diarrhea and vomiting. Genitourinary: Negative for dysuria. Musculoskeletal: Negative for back pain and neck pain. All other systems reviewed and are negative. Physical ExamED Triage Vitals [10/19/19 1036]BP: (!) 181/157Pulse: (!) 92Pulse from  O2 sat: n/aResp: (!) 185Temp: 98.6 ?F (37 ?C)Temp src: OralSpO2: 97 % BP (!) 158/92  - Pulse (!) 92  - Temp 98.6 ?F (37 ?C) (Oral)  - Resp (!) 185  - Ht 5' 1 (1.549 m)  - Wt 61.2 kg  - LMP 10/07/2019  - SpO2 98%  - BMI 25.51 kg/m? Physical ExamVitals signs and nursing note reviewed. Constitutional:     Appearance: Normal appearance. HENT:    Head: Normocephalic. Eyes:    Pupils: Pupils are equal,  round, and reactive to light. Neck:    Musculoskeletal: Normal range of motion. Cardiovascular:    Rate and Rhythm: Normal rate and regular rhythm.    Heart sounds: Normal heart sounds. Pulmonary:    Effort: Pulmonary effort is normal.    Breath sounds: Normal breath sounds. Abdominal:    General: Bowel sounds are normal.    Palpations: Abdomen is soft.    Tenderness: There is abdominal tenderness in the epigastric area. Skin:   General: Skin is warm. Neurological:    General: No focal deficit present.    Mental Status: She is alert.  ProceduresProcedures ED COURSEPatient Reevaluation: Patient presents with chronic pancreatitis and appears to be in no distress.  With her blood work.  Patient has had an extensive workup with this.  Not image this time unless blood work is indicative of the need.Patient's white count as well as lipase is acutely elevated.  Will get a [place] scan for further evaluation.  Discussed with hospitalist for admission.  [place] scan pending at time of admissionResults for orders placed or performed during the hospital encounter of 10/19/19-Lipase     Result                                            Value                         Ref Range                     Lipase                                            13,286 (H)                    73 - 393 U/L             -Hepatic function panel     Result                                            Value                         Ref Range                     Alkaline Phosphatase                              77                            45 - 117 U/L                  Alanine Aminotransferase (ALT)                    18                            13 - 56 U/L  Aspartate Aminotransferase (AST)                  12 (L)                        15 - 37 U/L                   Total Protein                                     7.6                           6.4 - 8.2 g/dL                Albumin                                           4.0                           3.4 - 5.0 g/dL Globulin                                          3.6                           2.5 - 5.0 g/dL                Bilirubin, Direct                                 0.07                          0.00 - 0.20 mg/dL             Total Bilirubin                                   0.2                           <1.0 mg/dL               -Basic metabolic panel     Result                                            Value                         Ref Range                     Glucose  115 (H)                       65 - 110 mg/dL                BUN                                               8                             7 - 18 mg/dL                  Creatinine                                        0.67                          0.55 - 1.02 mg/dL             eGFR (AFRICAN-AMERICAN)                           >60                           >60 mL/min/1.47m2             eGFR (NON African-American)                       >60                           >60 mL/min/1.29m2             Sodium                                            138                           136 - 145 mmol/L              Potassium                                         3.3 (L)                       3.5 - 5.1 mmol/L              Chloride                                          106                           98 - 107 mmol/L  CO2                                               27                            21 - 32 mmol/L                Anion Gap                                         5                             5 - 15 mmol/L                 Calcium                                           9.2                           8.5 - 10.1 mg/dL         -CBC auto differential     Result                                            Value                         Ref Range                     WBC                                               16.51 (H)                     4.00 - 10.00 x1000/?L RBC                                               4.02                          4.00 - 5.20 M/?L              Hemoglobin                                        12.1                          11.0 - 15.0 g/dL  Hematocrit                                        35.8                          34.0 - 45.0 %                 MCV                                               89.1                          79.0 - 99.0 fL                MCH                                               30.1                          27 - 33 pg                    MCHC                                              33.8                          32.0 - 36.0 g/dL              RDW-CV                                            14.0                          11.5 - 14.5 %                 Platelets                                         350                           140 - 400 x1000/?L            MPV                                               8.9  7.5 - 11.5 fL                 Neutrophils                                       73.6 (H)                      36.0 - 66.0 %                 Lymphocytes                                       18.9 (L)                      25.0 - 45.0 %                 Monocytes                                         5.4                           0.0 - 12.0 %                  Eosinophils                                       1.3                           0.0 - 4.0 %                   Basophil                                          0.5                           0.0 - 3.0 %                   Immature Granulocytes                             0.3                           0.0 - 1.0 %                   nRBC                                              0.0  0.0 - 5.0 %                   ANC (Abs Neutrophil Count)                        12.14 (H)                     1.40 - 6.60 x 1000/?L         Absolute Lymphocyte Count 3.12                          1.00 - 4.50 x 1000/?L         Monocyte Absolute Count                           0.89                          0.00 - 1.20 x 1000/?L         Eosinophil Absolute Count                         0.22                          0.00 - 0.45 x 1000/?L         Basophil Absolute Count                           0.09                          0.0 - 0.3 x 1000/?L           Absolute Immature Granulocyte Count               0.05                          0.00 - 0.10 x 1000/?L    Clinical Impressions as of Oct 18 1236 Acute pancreatitis, unspecified complication status, unspecified pancreatitis type  ED DispositionAdmit Theodoro Grist, MD06/20/21 1128 Theodoro Grist, MD06/20/21 1238

## 2019-10-19 NOTE — H&P
[facility]	 Patient Data:  Patient Name: Natasha English Age: 44 y.o. DOB: 1976/03/31	 MRN: [id number]	 Medicine History & PhysicalHistory provided by: the patientHistory limited by: no limitationsPatient presents from: HomeSubjective: Chief Complaint:  Abdominal painHPI 44 year old female with a prior history of alcohol abuse reports sobriety, chronic pancreatitis, pseudocyst, seizure, depression anxiety, PE presents to the emergency department with abdominal pain with nausea vomiting and diarrhea.  Lipase 13,000, leukocytosis of 16,000 and mild hypokalemia.  She was recently hospitalized for similar issue and underwent MRI/MRCP abdomen with and without contrast demonstrating stable collection of pancreatic head consistent with pseudocyst.Medical History: PMH PSH Past Medical History: Diagnosis Date ? Encounter for blood transfusion   during 2nd pregnancy ? Essential hypertension 10/12/2009 ? High cholesterol  ? History of abnormal cervical Papanicolaou smear  ? Pancreatic cyst 07/2018 ? Pancreatitis  ? Paroxysmal ventricular tachycardia (HC Code) 10/12/2009  RT OUTFLOW TRACT/ ablations x 6 ? Pulmonary embolism (HC Code) 2008  no blood thinners at this time ? Seizures (HC Code)   during pregnancy  Past Surgical History: Procedure Laterality Date ? CARDIAC ELECTROPHYSIOLOGY MAPPING AND ABLATION  04/23/2008  [place] ? CERVICAL CONE BIOPSY   ? CESAREAN SECTION   ? MIBI Exercise stress/test (NM)  10/08/2007  L&M ? MOUTH SURGERY   ? TONSILLECTOMY   ? TUBAL LIGATION   ? UPPER GASTROINTESTINAL ENDOSCOPY    Social History Family History Social History Tobacco Use ? Smoking status: Current Some Day Smoker   Packs/day: 0.50   Types: Cigarettes ? Smokeless tobacco: Never Used Substance Use Topics ? Alcohol use: Not Currently   Comment: daily 2-3 drinks/day  Family History Problem Relation Age of Onset ? Heart disease Father       Myocardial Infarction ? No Known Problems Mother  ? Breast cancer Maternal Grandmother    Family History family history includes Breast cancer in her maternal grandmother; Heart disease in her father; No Known Problems in her mother.Social History  reports that she has been smoking cigarettes. She has been smoking about 0.50 packs per day. She has never used smokeless tobacco. She reports previous alcohol use. She reports current drug use. Frequency: 7.00 times per week. Drug: Marijuana.Prior to Admission Medications Prior to Admission medications  Medication Sig Start Date End Date Taking? Authorizing Provider atorvastatin (LIPITOR) 20 MG tablet Take 20 mg by mouth daily.. 11/08/16  Yes [provider] buPROPion SR (WELLBUTRIN SR) 150 mg 12 hr tablet Take 300 mg by mouth daily.  04/26/19  Yes [provider] lamoTRIgine (LAMICTAL) 200 mg immediate release tablet Take 200 mg by mouth daily.  04/23/19  Yes [provider] ondansetron (ZOFRAN-ODT) 4 mg disintegrating tablet Place 4 mg onto the tongue every 8 (eight) hours as needed for nausea.   Yes [provider] pantoprazole (PROTONIX) 40 mg tablet Take 1 tablet (40 mg total) by mouth every 12 (twelve) hours. 05/08/18  Yes Lorina Rabon, MD PRENATAL VITAMIN PLUS LOW IRON 27 mg iron- 1 mg Tab tablet Take 1 tablet by mouth daily. *OTC 11/26/17  Yes [provider] VASCEPA 1 gram capsule TAKE 2 TABLETS BY MOUTH TWICE A DAYPatient taking differently: Take 2 g by mouth 2 (two) times daily.  08/01/18  Yes Beckey Downing, MD amLODIPine (NORVASC) 10 mg tablet Take 1 tablet (10 mg total) by mouth daily. Hold for blood pressure below 120 systolicPatient not taking: Reported on 10/19/2019 08/01/19   Lanice Shirts, MD  Allergies Allergies Allergen Reactions ? Lisinopril Swelling  Angioedema on May 11, 2018 ? Hydrochlorothiazide Other (See Comments)   Electrolyte imbalance  Review of Systems: Review of Systems Pertinent positives were discussed above in the HPI otherwise a 14 point review of systems was negative in full detail.Objective: Vitals:Last 24 hours: Temp:  [98.6 ?F (37 ?C)] 98.6 ?F (37 ?C)Pulse:  [92] 92Resp:  [185] 185BP: (151-181)/(75-157) 151/115SpO2:  [96 %-98 %] 96 %Physical Exam: Physical Exam Constitutional: She is oriented to person, place, and time. No distress. HENT: Head: Normocephalic and atraumatic. Eyes: EOM are normal. Neck: Normal range of motion. Cardiovascular: Normal rate. Pulmonary/Chest: Effort normal. No respiratory distress. Abdominal: There is abdominal tenderness. Musculoskeletal:       General: No edema. Neurological: She is alert and oriented to person, place, and time. Skin: Skin is dry.  Nursing notes and vitals reviewed.Labs: I have reviewed the patient's labs within the last 24 hrs.Diagnostics:Radiology results are pending.ECG/Tele Events: I have reviewed the patient's ECG as resulted in the EMR.Assessment & Plan: # acute on chronic pancreatitis- NPO- IV fluids 125cc/hr- p.r.n. antiemetics- trend lipase- obtain triglyceride level- obtain pancreatic elastase level- await [place] abdomen result- consult gastroenterology# depression- Lamictal, WellbutrinCode Status Full Code/ACLS D/C Status Expect in 48-72 hrs Disposition Home I confirmed that the patient's advanced care plan is present, code status documented, or surrogate decision makers listed in the patient's medical record.  Significant home medications on hold: noneOverall, will require more than two midnight stay. I have discussed the patients code status:YesWith:patientMy collaborating provider is Dr. Domenic Schwab and is aware of the decision making on the case.This report was created in part from templates and voice recognition software. Typographical and minor dictation errors may be present.Notifications: Family was notified of this admission. YesI have personally discussed the plan with the patient and/or family. YesI have reviewed the plan of care with the bedside nurse, including an emphasis on the most important aspects of care for the next 12 hours: yesI have utilized all available immediate resources to obtain, update, or review the patient's current medications.Signed:Milissa Fesperman Deborah Chalk, PA-C Available via Secure Chat6/20/20211:05 PMAddendum: None

## 2019-10-19 NOTE — ED Notes
11:30 AMLabs drawPIV site placed meds per MARNS infusing

## 2019-10-20 DIAGNOSIS — F329 Major depressive disorder, single episode, unspecified: Secondary | ICD-10-CM

## 2019-10-20 DIAGNOSIS — I10 Essential (primary) hypertension: Secondary | ICD-10-CM

## 2019-10-20 DIAGNOSIS — Z86711 Personal history of pulmonary embolism: Secondary | ICD-10-CM

## 2019-10-20 DIAGNOSIS — F1290 Cannabis use, unspecified, uncomplicated: Secondary | ICD-10-CM

## 2019-10-20 DIAGNOSIS — K861 Other chronic pancreatitis: Secondary | ICD-10-CM

## 2019-10-20 DIAGNOSIS — K863 Pseudocyst of pancreas: Secondary | ICD-10-CM

## 2019-10-20 DIAGNOSIS — F17200 Nicotine dependence, unspecified, uncomplicated: Secondary | ICD-10-CM

## 2019-10-20 DIAGNOSIS — Z8249 Family history of ischemic heart disease and other diseases of the circulatory system: Secondary | ICD-10-CM

## 2019-10-20 DIAGNOSIS — Z20822 Contact with and (suspected) exposure to covid-19: Secondary | ICD-10-CM

## 2019-10-20 DIAGNOSIS — K859 Acute pancreatitis without necrosis or infection, unspecified: Secondary | ICD-10-CM

## 2019-10-20 DIAGNOSIS — F419 Anxiety disorder, unspecified: Secondary | ICD-10-CM

## 2019-10-20 DIAGNOSIS — E7800 Pure hypercholesterolemia, unspecified: Secondary | ICD-10-CM

## 2019-10-20 DIAGNOSIS — Z803 Family history of malignant neoplasm of breast: Secondary | ICD-10-CM

## 2019-10-20 DIAGNOSIS — E876 Hypokalemia: Secondary | ICD-10-CM

## 2019-10-20 LAB — CBC WITH AUTO DIFFERENTIAL
BKR WAM ABSOLUTE IMMATURE GRANULOCYTES.: 0.05 x 1000/ÂµL (ref 0.00–0.10)
BKR WAM ABSOLUTE LYMPHOCYTE COUNT.: 2.45 x 1000/ÂµL (ref 1.00–4.50)
BKR WAM ABSOLUTE NEUTROPHIL COUNT.: 10.56 x 1000/ÂµL — ABNORMAL HIGH (ref 1.40–6.60)
BKR WAM BASOPHIL ABSOLUTE COUNT.: 0.05 x 1000/ÂµL (ref 0.0–0.3)
BKR WAM BASOPHILS: 0.4 % (ref 0.0–3.0)
BKR WAM EOSINOPHIL ABSOLUTE COUNT.: 0.07 x 1000/ÂµL (ref 0.00–0.45)
BKR WAM EOSINOPHILS: 0.5 % (ref 0.0–4.0)
BKR WAM HEMATOCRIT: 38.6 % (ref 34.0–45.0)
BKR WAM HEMOGLOBIN: 12.6 g/dL (ref 11.0–15.0)
BKR WAM IMMATURE GRANULOCYTES: 0.4 % (ref 0.0–1.0)
BKR WAM LYMPHOCYTES: 17.7 % — ABNORMAL LOW (ref 25.0–45.0)
BKR WAM MCH PG: 30 pg (ref 27–33)
BKR WAM MCHC: 32.6 g/dL (ref 32.0–36.0)
BKR WAM MCV: 91.9 fL (ref 79.0–99.0)
BKR WAM MONOCYTE ABSOLUTE COUNT.: 0.68 x 1000/ÂµL (ref 0.00–1.20)
BKR WAM MONOCYTES: 4.9 % (ref 0.0–12.0)
BKR WAM MPV: 9.1 fL (ref 7.5–11.5)
BKR WAM NEUTROPHILS: 76.1 % — ABNORMAL HIGH (ref 36.0–66.0)
BKR WAM NUCLEATED RED BLOOD CELLS: 0 % (ref 0.0–5.0)
BKR WAM PLATELETS: 335 x1000/ÂµL (ref 140–400)
BKR WAM RDW-CV: 13.7 % (ref 11.5–14.5)
BKR WAM RED BLOOD CELL COUNT.: 4.2 M/ÂµL (ref 4.00–5.20)
BKR WAM WHITE BLOOD CELL COUNT.: 13.86 x1000/ÂµL — ABNORMAL HIGH (ref 4.00–10.00)

## 2019-10-20 LAB — COMPREHENSIVE METABOLIC PANEL
BKR ALANINE AMINOTRANSFERASE (ALT): 15 U/L (ref 13–56)
BKR ALBUMIN: 3.5 g/dL (ref 3.4–5.0)
BKR ALKALINE PHOSPHATASE: 80 U/L (ref 45–117)
BKR ANION GAP (LM): 7 mmol/L (ref 5–15)
BKR ASPARTATE AMINOTRANSFERASE (AST): 10 U/L — ABNORMAL LOW (ref 15–37)
BKR BILIRUBIN TOTAL: 0.4 mg/dL (ref <1.0)
BKR BLOOD UREA NITROGEN: 5 mg/dL — ABNORMAL LOW (ref 7–18)
BKR CALCIUM: 8.6 mg/dL (ref 8.5–10.1)
BKR CHLORIDE: 106 mmol/L (ref 98–107)
BKR CO2: 24 mmol/L (ref 21–32)
BKR CREATININE: 0.5 mg/dL — ABNORMAL LOW (ref 0.55–1.02)
BKR EGFR (AFR AMER) (LMC): >60 mL/min/{1.73_m2} (ref >60)
BKR EGFR (NON AFR AMER) (LMC): >60 mL/min/{1.73_m2} (ref >60)
BKR GLOBULIN: 3.7 g/dL (ref 2.5–5.0)
BKR GLUCOSE: 109 mg/dL (ref 65–110)
BKR POTASSIUM: 4 mmol/L (ref 3.5–5.1)
BKR PROTEIN TOTAL: 7.2 g/dL (ref 6.4–8.2)
BKR SODIUM: 137 mmol/L (ref 136–145)

## 2019-10-20 LAB — MAGNESIUM: BKR MAGNESIUM: 1.9 mg/dL (ref 1.6–2.6)

## 2019-10-20 LAB — LIPASE: BKR LIPASE: 1088 U/L — ABNORMAL HIGH (ref 73–393)

## 2019-10-20 NOTE — Discharge Summary
[facility] Discharge SummaryPatient Data:  Patient Name: Natasha English Admit date: 10/19/2019 Age: 44 y.o. Discharge date: 10/20/19 DOB: 1975/08/06	 Discharge Attending Physician: Joseph Pierini, *  MRN: [id number]	 Discharged Condition: good PCP: Sherrilee Gilles, MD Disposition: Home  Principal Diagnosis: Acute pancreatitis, unspecified complication status, unspecified pancreatitis typeOther Active Diagnoses: Present on Admission:? Acute pancreatitis, unspecified complication status, unspecified pancreatitis typeIssues to be Addressed Post Discharge: Issues to be Addressed Post Discharge:1.	Follow up with GI2.	3.	Follow-up Information:W[place], Michelene [place] [phone] Future Appointments Date Time Provider Department Center 12/08/2019  8:00 AM Drumm, Hillary [place], APRN [facility] Course: Hospital Course: 44 year old female with history of chronic pancreatitis with pseudocyst, depression, anxiety, prior seizures who presents to the emergency department with recurrent abdominal pain, nausea and vomiting.  She had an elevated lipase of 13,000 with leukocytosis of 16,000.  She was made NPO with aggressive IV fluids.  She did have recent MRI and MRCP which showed a stable collection at the pancreatic head consistent with pseudocyst, biopsies at that time were deferred.  Overnight with supportive care patient had dramatic improvement in her symptoms and her lipase decreased to a 1000. Her diet is being advanced and if she tolerates her diet she will be discharged home.  She will follow-up with her PCP and GI outpatient.Pertinent lab findings and test results: Objective: Recent Labs Lab 06/20/211118 06/21/210655 WBC 16.51* 13.86* HGB 12.1 12.6 HCT 35.8 38.6 PLT 350 335  Recent Labs Lab 06/20/211118 06/21/210655 NEUTROPHILS 73.6* 76.1*  Recent Labs Lab 06/20/211118 06/21/210655 NA 138 137 K 3.3* 4.0 CL 106 106 CO2 27 24 BUN 8 5* CREATININE 0.67 0.50* GLU 115* 109 ANIONGAP 5 7  Recent Labs Lab 06/20/211118 06/21/210655 CALCIUM 9.2 8.6 MG  --  1.9  Recent Labs Lab 06/20/211118 06/21/210655 ALT 18 15 AST 12* 10* ALKPHOS 77 80 BILITOT 0.2 0.4 BILIDIR 0.07  --   No results for input(s): PTT, LABPROT, INR in the last 168 hours. Culture Information:No results for input(s): LABBLOO, LABURIN, LOWERRESPIRA in the last 168 hours.Imaging: Imaging results last 24h: No results found.Diet:  Regular dietMobility: Highest Level of mobility - ACTUAL: Mobility Level 7, Walk 25+ feet, AM PAC 22-23PT Disposition Recommendation:   Physical Exam Discharge vitals: Temp:  [97.7 ?F (36.5 ?C)-98.4 ?F (36.9 ?C)] 98.4 ?F (36.9 ?C)Pulse:  [73-85] 85Resp:  [17-18] 18BP: (149-174)/(81-96) 149/81SpO2:  [98 %-100 %] 98 %Device (Oxygen Therapy): room air Pertinent Findings of Physical Exam: UnremarkableCognitive Status at Discharge: Alert and Oriented x 3Discharge Physical Exam:Physical Exam Constitutional: No distress. HENT: Head: Normocephalic and atraumatic. Nose: Nose normal. Eyes: EOM are normal. Cardiovascular: Normal rate and regular rhythm. Pulmonary/Chest: Effort normal and breath sounds normal. Abdominal: Soft. Bowel sounds are normal. She exhibits no distension. There is no abdominal tenderness. Neurological: She is alert. Skin: Skin is warm. No erythema. Allergies Allergies Allergen Reactions ? Lisinopril Swelling   Angioedema on May 11, 2018 ? Hydrochlorothiazide Other (See Comments)   Electrolyte imbalance  PMH PSH Past Medical History: Diagnosis Date ? Encounter for blood transfusion   during 2nd pregnancy ? Essential hypertension 10/12/2009 ? High cholesterol ? History of abnormal cervical Papanicolaou smear  ? Pancreatic cyst 07/2018 ? Pancreatitis  ? Paroxysmal ventricular tachycardia (HC Code) 10/12/2009  RT OUTFLOW TRACT/ ablations x 6 ? Pulmonary embolism (HC Code) 2008  no blood thinners at this time ? Seizures (HC Code)   during pregnancy  Past Surgical History: Procedure Laterality Date ? CARDIAC ELECTROPHYSIOLOGY MAPPING AND  ABLATION  04/23/2008  [place] ? CERVICAL CONE BIOPSY   ? CESAREAN SECTION   ? MIBI Exercise stress/test (NM)  10/08/2007  L&M ? MOUTH SURGERY   ? TONSILLECTOMY   ? TUBAL LIGATION   ? UPPER GASTROINTESTINAL ENDOSCOPY    Social History Family History Social History Tobacco Use ? Smoking status: Current Some Day Smoker   Packs/day: 0.50   Types: Cigarettes ? Smokeless tobacco: Never Used Substance Use Topics ? Alcohol use: Not Currently   Comment: daily 2-3 drinks/day  Family History Problem Relation Age of Onset ? Heart disease Father       Myocardial Infarction ? No Known Problems Mother  ? Breast cancer Maternal Grandmother   Discharge Medications: Discharge: Current Discharge Medication List  CONTINUE these medications which have NOT CHANGED  Details atorvastatin (LIPITOR) 20 MG tablet Take 20 mg by mouth daily.Marland KitchenRefills: 0  buPROPion SR (WELLBUTRIN SR) 150 mg 12 hr tablet Take 150 mg by mouth 2 (two) times daily.   lamoTRIgine (LAMICTAL) 200 mg immediate release tablet Take 200 mg by mouth daily.   ondansetron (ZOFRAN-ODT) 4 mg disintegrating tablet Place 4 mg onto the tongue every 8 (eight) hours as needed for nausea.  pantoprazole (PROTONIX) 40 mg tablet Take 1 tablet (40 mg total) by mouth every 12 (twelve) hours.Qty: 30 tablet, Refills: 2  PRENATAL VITAMIN PLUS LOW IRON 27 mg iron- 1 mg Tab tablet Take 1 tablet by mouth daily. *OTCRefills: 3  VASCEPA 1 gram capsule TAKE 2 TABLETS BY MOUTH TWICE A DAY Qty: 120 capsule, Refills: 12   STOP taking these medications   amLODIPine (NORVASC) 10 mg tablet    There was at total of approximately 40 minutes spent on the discharge of this patientElectronically Signed:Yarieliz Wasser L Lessie Funderburke, PA 10/20/2019 3:03 PM

## 2019-10-20 NOTE — Plan of Care
Plan of Care Overview/ Patient Status    Problem: Adult Inpatient Plan of CareGoal: Readiness for Transition of CareOutcome: Interventions implemented as appropriateHPI: 44 year old female with a prior history of alcohol abuse reports sobriety, chronic pancreatitis, pseudocyst, seizure, depression anxiety, PE presents to the emergency department with abdominal pain with nausea vomiting and diarrhea.  Lipase 13,000, leukocytosis of 16,000 and mild hypokalemia.  She was recently hospitalized for similar issue and underwent MRI/MRCP abdomen with and without contrast demonstrating stable collection of pancreatic head consistent with pseudocyst. Admit with acute pancreatitis.Medical record reviewed. Met with pt at bedside.Pt lives at home with her SO and adult children. She is completely independent with all aspects of care. 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 She is current with her PCP appts and her GI doctor at [facility]. Family will provide transportation home. Plan: no needs identified.CM Assessment and Discharge Planning    Most Recent Value Case Management Assessment Do you have a caregiver?  No Patient Requires Care Coordination Intervention Due To  none identified on admission Arrived from prior to admission  home Services Prior to Admission  none ADL Assistance  None Type of Home Care Services  None What equipment do you currently use at home?  none Documented Insurance Accurate  Yes Any financial concerns related to anticipated discharge needs  No Patient's home address verified  Yes Patient's PCP of record verified  Yes Last Date Seen by PCP  0-3 months Living Environment  Lives With  Significant other, Child(ren), Adult Current Living Arrangements  home/apartment/condo Home Accessibility  no concerns Transportation Available  family or friend will provide Home Safety Feels Safe Living In Home  yes Source of Clinical History Patient's clinical history has been reviewed and source of Information is:  Patient, Medical record CM/SW Attestation: Choose which ONE is appropriate for you I have reviewed the medical record and completed the above evaluation with the following recommendations.  Yes Discharge Planning Coordination Recommendations Discharge Planning Coordination Recommendations  Home with MD follow-up/No needs identified RN reviewed plan of care/ continuum of care need's with   Patient Discharge Planning Concerns to be Addressed  denies needs/concerns at this time, no discharge needs identified Referral(s) placed for:  None Patient goals/treatment preference for discharge are:   d/c home Home Health Care Services Required  N/A Equipment Needed After Discharge  none Mode of Transportation   Private car  (add comment for special considerations) Patient to be accompanied by  family member Is the patient discharging to their home address  Yes CM D/C Readiness PASRR completed and approved  N/A Authorization number obtained, if required  N/A Is there a 3 day INPATIENT Qualifying stay for Medicare Patients?  N/A Medicare IM- signed, dated, timed and scanned, if required  N/A DME Authorized/Delivered  N/A No needs identified/ follow up with PCP/MD  Yes Post acute care services secured W10 complete  N/A Pri Completed and Accepted   N/A Finalized Plan Expected Discharge Date  10/20/19 Discharge Disposition  Home or Self Care Post acute care services secured W10 complete  N/A Physician documentation required  AVS (Patient Instructions), Prescriptions Discharge Coordination/Progress  Anticipate pt will discharge home this afternoon once cleared by GI. Family will transport. No needs.   Tsosie Billing, RN, MSNCase [phone]. [phone] or [phone]

## 2019-10-20 NOTE — Plan of Care
Plan of Care Overview/ Patient Status    Admission Note Nursing Natasha English is a 44 y.o. female admitted with a chief complaint of abdominal pain. Patient arrived from the ED. Patient is A/Ox4, VSS on RA. Abdominal pain managed with prn oxycodone and sq morphine. Pt did report 1 episode of vomiting, nausea managed with prn iv Zofran and po compazine. NS running at 125 mL/hr. Pt is independent in room. Call light within reach. Frequent rounding, safety maintained.    Vitals:  10/19/19 1430 10/19/19 1500 10/19/19 1617 10/19/19 1647 BP: (!) 158/72 (!) 159/77 (!) 174/93 (!) 162/96 Pulse:   73 80 Resp:   18  Temp:   97.7 ?F (36.5 ?C)  TempSrc:   Oral  SpO2: 97% 97% 100%  Weight:    63.5 kg Height:    5' 1 (1.549 m) Oxygen therapy Oxygen TherapySpO2: 100 %Device (Oxygen Therapy): room airI have reviewed the patient's current medication orders..See flowsheets, patient education and plan of care for additional information.

## 2019-10-20 NOTE — Other
Natasha English	Gastroenterology Consult Note Consult Information: Consultation requested by: Joseph Pierini, *Reason for consultation: PancreatitisSource of Information: Patient, Doctor and EMR/Previous RecordPresentation History: 44?y/o F, PMH recurrent pancreatitis with known pseudocyst, prior alcohol abuse, hypertriglyceridemia/dyslipidemia, Bipolar disorder, HTN, HLD, paroxysmal VT?who presented with abd pain, N/V. [place] with IV contrast yesterday revealed known pancreatic head lesion. She is feeling improved today and has been started on a diet. Review of Medical History/Allergies/Family History/Social History/Medications: Gastrointestinal History: Follows with Dr. Verdis Prime of [facility]. She has had three admission for pancreatitis (07/2018, 04/2019, 05/2019). Imaging revealed cystic lesion in pancreatic head with pancreatic ductal dilation. Pseudocyst appeared to be enlarging on repeat imaging. She had an EUS at [facility] with Dr. Mauro Kaufmann 06/27/19 which revealed pancreatic pseudocyst in head of pancreas with dilated pancreatic duct. Repeat MRI in May, 2021 with stable pancreatic head collection. She has follow up with Dr. Mauro Kaufmann 11/2019, consideration for EUS +/- ERCP if continued ductal dilation.  Past Medical/Surgical History:Past Medical History: Diagnosis Date ? Encounter for blood transfusion   during 2nd pregnancy ? Essential hypertension 10/12/2009 ? High cholesterol  ? History of abnormal cervical Papanicolaou smear  ? Pancreatic cyst 07/2018 ? Pancreatitis  ? Paroxysmal ventricular tachycardia (HC Code) 10/12/2009  RT OUTFLOW TRACT/ ablations x 6 ? Pulmonary embolism (HC Code) 2008  no blood thinners at this time ? Seizures (HC Code)   during pregnancy Past Surgical History: Procedure Laterality Date ? CARDIAC ELECTROPHYSIOLOGY MAPPING AND ABLATION  04/23/2008  [place] ? CERVICAL CONE BIOPSY   ? CESAREAN SECTION   ? MIBI Exercise stress/test (NM)  10/08/2007  L&M ? MOUTH SURGERY   ? TONSILLECTOMY   ? TUBAL LIGATION   ? UPPER GASTROINTESTINAL ENDOSCOPY    Allergies:Lisinopril and HydrochlorothiazideFamily History: Denies FH colon/gastric/pancreatic/liver cancer.?Social History: Prior history of alcohol abuse. Marijuana use. Home Medications:Marland Kitchen  Medication Sig Taking? atorvastatin (LIPITOR) 20 MG tablet Take 20 mg by mouth daily.. Yes buPROPion SR (WELLBUTRIN SR) 150 mg 12 hr tablet Take 150 mg by mouth 2 (two) times daily.  Yes lamoTRIgine (LAMICTAL) 200 mg immediate release tablet Take 200 mg by mouth daily.  Yes ondansetron (ZOFRAN-ODT) 4 mg disintegrating tablet Place 4 mg onto the tongue every 8 (eight) hours as needed for nausea. Yes pantoprazole (PROTONIX) 40 mg tablet Take 1 tablet (40 mg total) by mouth every 12 (twelve) hours. Yes PRENATAL VITAMIN PLUS LOW IRON 27 mg iron- 1 mg Tab tablet Take 1 tablet by mouth daily. *OTC Yes VASCEPA 1 gram capsule TAKE 2 TABLETS BY MOUTH TWICE A DAYPatient taking differently: Take 2 g by mouth 2 (two) times daily.  Yes Inpatient Medications:Current Facility-Administered Medications Medication Dose Route Frequency Provider Last Rate Last Admin ? buPROPion XL (WELLBUTRIN XL) 24 hr tablet 300 mg  300 mg Oral Daily [place], Marissa, PA-C   300 mg at 10/20/19 8119 ? famotidine (PEPCID) tablet 20 mg  20 mg Oral BID Tennant, Marissa, PA-C   20 mg at 10/20/19 1478 ? lamoTRIgine (laMICtal) Immediate Release tablet 200 mg  200 mg Oral Daily Dorena Cookey, PA-C   200 mg at 10/20/19 2956 ? rosuvastatin (CRESTOR) tablet 10 mg  10 mg Oral Daily Dorena Cookey, PA-C   10 mg at 10/20/19 2130 ? sodium chloride 0.9 % flush 3 mL  3 mL IV Push Q8H Tennant, Marissa, PA-C     Review of Systems: A 14 point review of systems was completed, and pertinent review of systems per HPI.Review of SystemsPhysical Exam: Vitals:Vitals:  10/20/19 0555  BP: (!) 149/81 Pulse: 85 Resp: 18 Temp: 98.4 ?F (36.9 ?C) Physical Exam Constitutional: No distress. HENT: Head: Normocephalic and atraumatic. Eyes: Right eye exhibits no discharge. Left eye exhibits no discharge. No scleral icterus. Cardiovascular: Normal rate. Pulmonary/Chest: Effort normal. No respiratory distress. She has no wheezes. She has no rales. Abdominal: Soft. She exhibits no distension.Review of Labs/Diagnostics: Lab Review:Recent Labs Lab 06/20/211118 06/21/210655 WBC 16.51* 13.86* HGB 12.1 12.6 HCT 35.8 38.6 PLT 350 335 MCV 89.1 91.9 Recent Labs Lab 06/20/211118 06/21/210655 NA 138 137 K 3.3* 4.0 CL 106 106 CO2 27 24 BUN 8 5* CREATININE 0.67 0.50* No results for input(s): INR, PROTIME, AMMONIA in the last 168 hours.Recent Labs Lab 06/20/211118 06/21/210655 PROT 7.6 7.2 ALBUMIN 4.0 3.5 BILITOT 0.2 0.4 BILIDIR 0.07  --  AST 12* 10* ALT 18 15 ALKPHOS 77 80 LIPASE 13,286* 1,088* Diagnostic Review: I have reviewed the patient's Radiology report(s) within the last 48 hours. Significant findings include:  Mri Abdomen W Wo Iv Contrast MrcpResult Date: 5/4/2021IMPRESSION: Stable collection associated with the pancreatic head has described. Reported And Signed By: Liliane Channel, MD Impression: 44?y/o F, PMH recurrent pancreatitis with known pseudocyst, prior alcohol abuse, hypertriglyceridemia/dyslipidemia, Bipolar disorder, HTN, HLD, paroxysmal VT?who presented with abd pain, N/V. [place] with IV contrast yesterday revealed known pancreatic head lesion. She is feeling improved today and has been started on a diet. I have reviewed the patient's problem list and updated it as needed.Recommendations: Recurrent pancreatitis in setting of known pseudocyst. Advance to low fat diet and if tolerates okay to DC home from GI standpoint. She has f/u scheduled with [facility]. This consultation was seen with Zia Najera, and further recommendations are pending his evaluation. This consultation was appreciated. Signed: Domingo Dimes, PA-C6/21/20212:28 PMI independently evaluated the patient agree with physician assistant assessment plan.  Patient feels well and reports no significant abdominal pain at this time.  Imaging studies consistent with pseudocyst and so prior EUS with FNA that showed no evidence of malignancy.  Patient is arrange for follow-up for EUS in [facility]  Patient tolerated diet.  Okay to DC home. Will sign off, please recall p.r.n.Signed:Zonia Caplin, MD6/21/20212:38 PM

## 2019-10-20 NOTE — Plan of Care
Ella Jubilee was discharged via Private Car accompanied by [employer].  Verbalized understanding of discharge instructionsand recommended follow up care as per the after visit summary.  Written discharge instructions provided. Denies any further questions. Vital signs    Vitals:  10/19/19 1647 10/19/19 2032 10/20/19 0555 10/20/19 1510 BP: (!) 162/96 (!) 151/87 (!) 149/81 138/70 Pulse: 80 78 85 88 Resp:  17 18 18  Temp:  98.4 ?F (36.9 ?C) 98.4 ?F (36.9 ?C) 99.1 ?F (37.3 ?C) TempSrc:  Oral Oral  SpO2:  98% 98% 98% Weight: 63.5 kg    Height: 5' 1 (1.549 m)    Plan of Care Overview/ Patient Status    Discharge instructions reviewed with the patient. IV was removed. She felt good enough to walk to the main entrance were her daughter is picking her up.

## 2019-10-20 NOTE — Plan of Care
Plan of Care Overview/ Patient Status    Patient A+Ox4. Epigastric pain controlled with PRN morphine and oxycodone. Nausea treated with IV zofran. NS @ 125 continues. She is up out of bed independently. Will continue to monitor.

## 2019-10-20 NOTE — Plan of Care
Plan of Care Overview/ Patient Status    Shift 2300-0700Patient a&o, pleasant, up indep, NPO, complaints of belly pain relieved with alternating roxy and morphine. Nausea relieved by zofran but no vomiting. In good spirits. Slept intermittently due to pain, states she doesn't feel well rested but is hoping today will be better. Continue POC.

## 2019-11-17 ENCOUNTER — Encounter: Admit: 2019-11-17 | Payer: PRIVATE HEALTH INSURANCE | Attending: Gastroenterology

## 2019-11-17 DIAGNOSIS — K862 Cyst of pancreas: Secondary | ICD-10-CM

## 2019-11-21 ENCOUNTER — Inpatient Hospital Stay: Admit: 2019-11-21 | Discharge: 2019-11-21 | Payer: BLUE CROSS/BLUE SHIELD

## 2019-11-21 DIAGNOSIS — Z20822 Contact with and (suspected) exposure to covid-19: Secondary | ICD-10-CM

## 2019-11-21 DIAGNOSIS — Z20828 Contact with and (suspected) exposure to other viral communicable diseases: Secondary | ICD-10-CM

## 2019-11-21 LAB — COVID-19 CLEARANCE OR FOR PLACEMENT ONLY: BKR SARS-COV-2 RNA (COVID-19) (YH): NEGATIVE

## 2019-12-08 ENCOUNTER — Inpatient Hospital Stay: Admit: 2019-12-08 | Discharge: 2019-12-08 | Payer: BLUE CROSS/BLUE SHIELD

## 2019-12-08 ENCOUNTER — Ambulatory Visit: Admit: 2019-12-08 | Payer: PRIVATE HEALTH INSURANCE | Attending: Family

## 2019-12-08 DIAGNOSIS — K862 Cyst of pancreas: Secondary | ICD-10-CM

## 2019-12-08 MED ORDER — GADOTERATE MEGLUMINE 0.5 MMOL/ML (376.9 MG/ML) INTRAVENOUS SOLUTION
0.5 mmol/mL (376.9 mg/mL) | Freq: Once | INTRAVENOUS | Status: CP | PRN
Start: 2019-12-08 — End: ?
  Administered 2019-12-08: 21:00:00 0.5 mL via INTRAVENOUS

## 2019-12-08 MED ORDER — SODIUM CHLORIDE 0.9 % BOLUS (NEW BAG)
0.9 % | Freq: Once | INTRAVENOUS | Status: CP | PRN
Start: 2019-12-08 — End: ?
  Administered 2019-12-08: 21:00:00 0.9 mL/h via INTRAVENOUS

## 2019-12-12 ENCOUNTER — Encounter: Admit: 2019-12-12 | Payer: PRIVATE HEALTH INSURANCE | Attending: Family

## 2019-12-12 ENCOUNTER — Ambulatory Visit: Admit: 2019-12-12 | Payer: PRIVATE HEALTH INSURANCE | Attending: Family

## 2019-12-12 DIAGNOSIS — Z8742 Personal history of other diseases of the female genital tract: Secondary | ICD-10-CM

## 2019-12-12 DIAGNOSIS — E7800 Pure hypercholesterolemia, unspecified: Secondary | ICD-10-CM

## 2019-12-12 DIAGNOSIS — I4729 Paroxysmal ventricular tachycardia (HC Code): Secondary | ICD-10-CM

## 2019-12-12 DIAGNOSIS — Z5189 Encounter for other specified aftercare: Secondary | ICD-10-CM

## 2019-12-12 DIAGNOSIS — K859 Acute pancreatitis without necrosis or infection, unspecified: Secondary | ICD-10-CM

## 2019-12-12 DIAGNOSIS — R569 Unspecified convulsions: Secondary | ICD-10-CM

## 2019-12-12 DIAGNOSIS — K863 Pseudocyst of pancreas: Secondary | ICD-10-CM

## 2019-12-12 DIAGNOSIS — I10 Essential (primary) hypertension: Secondary | ICD-10-CM

## 2019-12-12 DIAGNOSIS — I2699 Other pulmonary embolism without acute cor pulmonale: Secondary | ICD-10-CM

## 2019-12-12 DIAGNOSIS — K861 Other chronic pancreatitis: Secondary | ICD-10-CM

## 2019-12-12 DIAGNOSIS — K862 Cyst of pancreas: Secondary | ICD-10-CM

## 2019-12-12 MED ORDER — CREON 24,000-76,000-120,000 UNIT CAPSULE,DELAYED RELEASE
24000-76000 -120,000 unit | ORAL_CAPSULE | Freq: Three times a day (TID) | ORAL | 1 refills | Status: AC
Start: 2019-12-12 — End: ?

## 2019-12-12 NOTE — Patient Instructions
Restart creonFollow up telehealth in 3 months (Nov 2021) with Floreen Comber APRN

## 2019-12-12 NOTE — Progress Notes
VIDEO TELEHEALTH VISIT: This clinician is part of the telehealth program and is conducting this visit in a currently approved location. For this visit the clinician and patient were present via interactive audio & video telecommunications system that permits real-time communications.Consent was signed via the Patient Acknowledgement and Financial Authorization Form and the Ambulatory Telehealth Consent Form. State patient is located in: [place] clinician is appropriately licensed in the above state to provide care for this visit. Other individuals present during the telehealth encounter and their role/relation: none Because this visit was completed over video, a hands-on physical exam was not performed.  Patient/parent or guardian understands and knows to call back if condition changes.[facility] DISEASEPhone: [phone]: [phone]: 12/12/2019 Patient referred to [facility] for consultation by: Craig Guess, MD[address],  [place] [id number] for Referral:   ICD-10-CM  1. Chronic pancreatitis, unspecified pancreatitis type (HC Code)  K86.1 lipase-protease-amylase (CREON) 24,000-76,000 -120,000 unit per delayed release capsule 2. Acute pancreatitis, unspecified complication status, unspecified pancreatitis type  K85.90  3. Pseudocyst of pancreas  K86.3   HPI: Natasha English is a 44 y.o. female being interviewed today due to 1. Chronic pancreatitis, unspecified pancreatitis type (HC Code)  lipase-protease-amylase (CREON) 24,000-76,000 -120,000 unit per delayed release capsule 2. Acute pancreatitis, unspecified complication status, unspecified pancreatitis type   3. Pseudocyst of pancreas    I have reviewed the patient's referral records, including past medical history, past physical exams, pertinent labs, imaging and prior plans of care.  Pt sees [place] today in follow up for chronic pancreatitis.  At last visit in May, she had been feeling well.  Imaging had been stable, but she had reported weight loss after stopping creon.  Now she reports she has almost daily discomfort at 4/10.  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  She was hospitalized in June for pancreatitis.  She notices that fatty and other heavy or spicy foods bother her more now.  She is having loose stools alternating with constipation at times.  She has lost another 3-5 lbs in the last 3 months.  No vomiting, no blood in stool.  Medical History: PMH PSH Past Medical History: Diagnosis Date ? Encounter for blood transfusion   during 2nd pregnancy ? Essential hypertension 10/12/2009 ? High cholesterol  ? History of abnormal cervical Papanicolaou smear  ? Pancreatic cyst 07/2018 ? Pancreatitis  ? Paroxysmal ventricular tachycardia (HC Code) 10/12/2009  RT OUTFLOW TRACT/ ablations x 6 ? Pulmonary embolism (HC Code) 2008  no blood thinners at this time ? Seizures (HC Code)   during pregnancy  Past Surgical History: Procedure Laterality Date ? CARDIAC ELECTROPHYSIOLOGY MAPPING AND ABLATION  04/23/2008  [place] ? CERVICAL CONE BIOPSY   ? CESAREAN SECTION   ? MIBI Exercise stress/test (NM)  10/08/2007  [facility] ? MOUTH SURGERY   ? TONSILLECTOMY   ? TUBAL LIGATION   ? UPPER GASTROINTESTINAL ENDOSCOPY    Social History Family History Social History Tobacco Use ? Smoking status: Current Some Day Smoker   Packs/day: 0.50   Types: Cigarettes ? Smokeless tobacco: Never Used Substance Use Topics ? Alcohol use: Not Currently   Comment: daily 2-3 drinks/day  Family History Problem Relation Age of Onset ? Heart disease Father       Myocardial Infarction ? No Known Problems Mother  ? Breast cancer Maternal Grandmother   Medications Current Outpatient Medications (CARDIOVASCULAR) Medication  Sig ? atorvastatin Take 20 mg by mouth daily.. Current Outpatient Medications (CNS DRUGS) Medication Sig ? lamoTRIgine Take 200 mg by mouth daily.  Current Outpatient Medications (PSYCHOTHERAPEUTIC DRUGS) Medication Sig ? buPROPion SR Take 150 mg by mouth 2 (two) times daily.  Current Outpatient Medications (GASTROINTESTINAL) Medication Sig ? pantoprazole Take 1 tablet (40 mg total) by mouth every 12 (twelve) hours. ? Creon Take 2 capsules by mouth 3 (three) times daily with meals. ? ondansetron Place 4 mg onto the tongue every 8 (eight) hours as needed for nausea. ? Vascepa TAKE 2 TABLETS BY MOUTH TWICE A DAY (Patient taking differently: Take 2 g by mouth 2 (two) times daily. ) Current Outpatient Medications (PRE-NATAL VITAMINS) Medication Sig ? Prenatal Vitamin Plus Low Iron Take 1 tablet by mouth daily. *OTC   Allergies Allergies Allergen Reactions ? Lisinopril Swelling   Angioedema on May 11, 2018 ? Hydrochlorothiazide Other (See Comments)   Electrolyte imbalance  Family History: non-contributoryReview of Systems:  Pt reports any of the following within the last 2 weeks: [] Denies any symptoms other than mentioned in HPI  [] Patient unable to provide meaningful ROS information because of language or cognitive limitations. [] Unintended weight loss >5lbs []  Unintended weight gain >5lbs [] Poor appetite [x] Feels full quickly when eating [] Fevers/chills [] Feels tired sooner than others [] Jaundice [] Yellowing of eyes [] Sore throat [] Hoarseness or weak voice [] Shortness of breath [] Chronic cough [] Chest pain or discomfort:       [] Burning; [] Non-burning [] Palpitations [] Difficulty swallowing [] Food or liquid come back up to the throat:     [] Food; [] Liquid [] Hiccups [] Belching/burping [] Nausea or vomiting:     [] Nausea; [] Vomiting [] Abdominal distension or bloating [x] Abdominal pain or discomfort level_4____/10 on 0-10 scale.  [x] Diarrhea [x] Constipation [] Fecal incontinence (loss of control of stool) [] Rectal bleeding [] Blood clots in arms or legs     [] Arms; [] Legs [] Swelling of arms or legs     [] Arms; [] Legs []  Use of Blood Thinner other than aspirin []  Issues with Anesthesia in past [] Difficulty or pain during urination [] Pain in the:     [] Muscles [] Joints [] Skin rashes [] Easy bleeding or bruising [] Frequent or Severe Headaches [] Dizziness [] Problems with balance [] Loss of consciousness [] Tingling or weakness in arms or legs     [] Arms; [] Legs [] Seizures [x] Problem with      [x] Memory [x] Paying attention [x] Problems with      [x] Anxiety [] Depression Objective: Limited physical exam was performed due to telehealth visit. There were no vitals filed for this visit.  Physical Exam:Weight: Wt Readings from Last 1 Encounters: 12/12/19 61.2 kg  Gen: pleasant in NADRespirations: normal effortNeuro: AxOx3 Skin shows no signs of jaundiceLabs:Lipase Date Value Ref Range Status 10/20/2019 1,088 (H) 73 - 393 U/L Final Total Bilirubin Date Value Ref Range Status 10/20/2019 0.4 <1.0 mg/dL Final   Comment:   Use of this assay is not recommended for patients undergoing treatment with Eltrombopag due to the potential for falsely elevated results.  10/19/2019 0.2 <1.0 mg/dL Final   Comment:   Use of this assay is not recommended for patients undergoing treatment with Eltrombopag due to the potential for falsely elevated results.  08/28/2019 0.1 <1.0 mg/dL Final   Comment:   Use of this assay is not recommended for patients undergoing treatment with Eltrombopag due to the potential for falsely elevated results.  07/31/2019 0.3 <1.0 mg/dL Final   Comment:   Use of this assay is not recommended for patients undergoing treatment with Eltrombopag due to the potential for falsely elevated results. Bilirubin, Direct Date Value  Ref Range Status 10/19/2019 0.07 0.00 - 0.20 mg/dL Final 54/12/8117 1.47 8.29 - 0.20 mg/dL Final 56/21/3086 5.78 4.69 - 0.20 mg/dL Final 62/95/2841 3.24 4.01 - 0.20 mg/dL Final Aspartate Aminotransferase (AST) Date Value Ref Range Status 10/20/2019 10 (L) 15 - 37 U/L Final 10/19/2019 12 (L) 15 - 37 U/L Final 08/28/2019 12 (L) 15 - 37 U/L Final 07/31/2019 9 (L) 15 - 37 U/L Final Alanine Aminotransferase (ALT) Date Value Ref Range Status 10/20/2019 15 13 - 56 U/L Final 10/19/2019 18 13 - 56 U/L Final 08/28/2019 19 13 - 56 U/L Final 07/31/2019 20 13 - 56 U/L Final Alkaline Phosphatase Date Value Ref Range Status 10/20/2019 80 45 - 117 U/L Final 10/19/2019 77 45 - 117 U/L Final 08/28/2019 90 45 - 117 U/L Final 07/31/2019 96 45 - 117 U/L Final GGT Date Value Ref Range Status 08/03/2018 40 5 - 55 U/L Final 01/07/2018 396 (H) 5 - 55 U/L Final Albumin Date Value Ref Range Status 10/20/2019 3.5 3.4 - 5.0 g/dL Final 02/72/5366 4.0 3.4 - 5.0 g/dL Final 44/06/4740 3.7 3.4 - 5.0 g/dL Final 59/56/3875 4.4 3.4 - 5.0 g/dL Final  No results found for: IGGResults for orders placed or performed during the hospital encounter of 11/21/19 COVID-19 Clearance or for Placement Only  Specimen: Nasopharynx; Viral Result Value Ref Range  SARS-CoV-2 RNA (COVID-19) Negative Negative Imaging:Xr Chest Pa Or ApResult Date: 4/1/2021[facility] [address] [place], [place] [address] [phone] XR CHEST PA OR AP [place]       Sex: Jobe Marker: [id number]  MRN: [id number] Service Date: 2019-07-31 09:44:39 CHEST - SINGLE VIEW CLINICAL INDICATION: r/o effusion, acute pancreatitis COMPARISON:  01/02/2018 TECHNIQUE: A portable upright view of the chest was performed at 09:46 a.m. FINDINGS:  The lungs are clear. The heart and mediastinum are within normal limits. Visualized bones are intact. IMPRESSION:  No acute process is seen. Signed By: Ulyses Amor MD, 2019/07/31 12:37:57Xr Chest Pa Or ApResult Date: 9/4/2019[facility] [address] [place], [place] [address] [phone] XR CHEST PA OR AP TAMICKA SHIMON       Sex: Jobe Marker: [id number]  MRN: [id number] Service Date: 2018-01-02 14:38:46 XR CHEST PA OR AP CLINICAL INDICATION:  Hypoxia TECHNIQUE:   AP chest COMPARISON:   Chest x-ray  08/23/1988 FINDINGS: Image is expiratory. There is bibasilar increased density. Lungs otherwise appear clear. Pleural spaces appear grossly clear. No pulmonary vascular engorgement is noted. No acute osseous fracture noted. IMPRESSION: Expiratory exam. Bibasilar increased density may merely represent compressive atelectasis. Infiltrate is also within the differential. Correlate clinically. Signed By: Herma Ard MD, 2018/01/02 15:48:22Ct Head Delila Spence Date: 4/1/2021[facility] [address] [place], [place] [address] [phone] Virgie HEAD WO IV CONTRAST JASLYNN THOME       Sex: Carmon Ginsberg  DOB: [id number]  MRN: [id number] Service Date: 2019-07-31 10:54:09 [place] HEAD WO IV CONTRAST CLINICAL INDICATION:   Vertigo, central TECHNIQUE:   [employer] scan of the head without contrast. Coronal and sagittal reformatted images available for review. B0175 - Radiation dose acquired during scan:  644.98 mGy.cm . The [employer] strives for high quality imaging with the lowest possible radiation dose.  For more information on medical radiation exposure please visit:  [email or web address] . COMPARISON:   None FINDINGS: Scout view demonstrates no focal abnormality. 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 Brain stem and posterior fossa/cerebellum are unremarkable. Gray-white differentiation is intact.  There is no infarct or mass. No parenchymal hemorrhage or extra-axial collection is identified. No discrete white matter lesion is identified. Ventricles, cisterns and sulci are normal. No sellar or suprasellar lesion is identified. There is no Chiari malformation. Midline structures are normal. Mastoid air cells and middle ear cavities are clear. Left to right nasal septal deviation is identified. Globes and orbits are unremarkable. IMPRESSION: No acute intracranial abnormality. No infarct, mass, parenchymal hemorrhage or extra-axial collection. No discrete white matter lesion. 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 Location: [facility], [place] Signed By: Waverly Ferrari MD, 2019/07/31 11:06:00Ct Abdomen W Delila Spence Date: 12/16/2020[facility] [address] [place], [place] [address] [phone] [place] ABDOMEN W WO IV CONTRAST BUFFY JARECKI       SexCarmon Ginsberg  DOB: [id number]  MRN: [id number] Service Date: 2019-04-16 14:55:47 [place] of the abdomen without and with nonionic intravenous contrast - oral contrast: Yes Radiation dose lowering was achieved by the use of an iterative reconstruction technique. HISTORY: Pancreatitis suspected COMPARISON: 21 October 2018 and 02 August 2018 MRI studies of the abdomen, and 01 August 2018, 05 May 2018 and 01 Sep 2017 [place] studies of the abdomen/pelvis. Pre IV contrast:  No apparent visceral calcification or finding to suggest hemorrhage. Lung bases: Clear. The heart is normal in size. Liver: Unremarkable. Spleen: Unremarkable. Pancreas: There is mild infiltration of the mesenteric fat along the head and body region, consistent with pancreatitis. The previously noted rounded hypodensity in the head region, potentially representing a pseudocyst, now measures 2.0 cm in maximum diameter, compared with 1.8 cm on the June 2020 and 1.5 cm on the April 2020 MRI studies. The pancreatic duct remains moderately distended. Gallbladder: Unremarkable. Kidneys: Enhance symmetrically, without findings to suggest obstruction or mass. Adrenals/lymph nodes: No adrenal mass or lymphadenopathy. Mesentery/vessels: A small umbilical hernia contains only fat. Skin/bones/subcutaneous tissues: No apparent aggressive bony abnormality. Visualized bowel and pelvis: Unremarkable. What appears to be the partially visualized appendix appears normal. IMPRESSION: Findings consistent with mild, uncomplicated pancreatitis. Mild interval enlargement of a pancreatic head hypodensity, potentially representing a pseudocyst. A follow-up endoscopic ultrasound and biopsy should be considered because of the possibility of malignancy. Alternatively, a follow-up MRI of the abdomen without and with intravenous contrast, focusing on the pancreas, may be helpful. Estimated radiation exposure: 422.79 mGy.cm L2440 Signed By: Nelva Bush MD, 2019/04/16 16:21:21Mri Abdomen W Ilda Basset Iv ContrastResult Date: 8/10/2021MRI ABDOMEN W WO IV CONTRAST HISTORY: Pancreatic cyst. COMPARISON: MRI abdomen 09/18/2019; [place] scan abdomen/pelvis 10/19/2019 TECHNIQUE:  Multiplanar multi sequence imaging of the abdomen was obtained before and after intravenous contrast administration (14 cc Dotarem). MRCP images were obtained prior to contrast administration.  Those images were reformatted as a 3D sequence by the MRI technologist at the dependent workstation.  The reformatted 3D images were saved to the PACS system. FINDINGS: The gallbladder wall does not appear thickened. There is slight common bile duct enlargement, measuring up to 0.8 cm (previously 0.7 cm). Pancreatic ductal dilatation is similar, measuring up to 0.7 cm. A 1.6 x 1.2 cm cystic lesion in the head of the pancreas is similar in overall dimensions but now has a less lobular wall. No associated enhancement is evident. Mild edema is seen along the head of the pancreas and proximal duodenum. The liver and spleen are not enlarged. A wedge-shaped area of more conspicuous enhancement anterolaterally in the right hepatic lobe is similar, likely perfusional. Portal, splenic, and superior mesenteric  venous patency is maintained. No adrenal lesion is seen. There is no hydronephrosis. No free fluid is seen.   Compared to May 2021: Similar size of pancreatic head cystic lesion which now demonstrates less lobular contours. Mild edema along head of pancreas and proximal duodenum. Correlate for symptomatology reflective of acute pancreatitis. Slight common bile duct prominence, slightly more so than before. Reported and signed by:  Jolyn Lent, MD Mri Abdomen Junius [place] Date: 12/16/2020[facility] [address] [place], [place] [address] [phone] MRI ABDOMEN W WO IV CONTRAST KIFFANY SCHELLING       SexCarmon Ginsberg  DOB: [id number]  MRN: [id number] Service Date: 2018-10-21 10:36:38 Addendum created at 04/16/2019 4:27:19 PM: ADDENDUM: Upon further review, there is a 1.8 cm lobulated focus of abnormal enhancement in the pancreatic head, similar in location to the abnormality noted on the April 2020 MRI. Though not specifically measured on that study, the corresponding focus of hypo enhancement measured 1.5 cm in maximum diameter on that study. Revised IMPRESSION: Mild interval enlargement of a pancreatic head abnormality. Please see the 16 April 2019 [place] of the abdomen report for further details and recommendation. Addendum by: Audrie Gallus Coleman M.D. MRI of the abdomen without and with 14 ml intravenous Dotarem HISTORY: Follow-up pancreatic cyst. Standard sequences were supplemented by coronal and serial axial T1 weighted postgadolinium sequences. COMPARISON:  02 August 2018 MRI of the abdomen and 01 August 2018 [place] of the abdomen/pelvis. Lung bases:  Clear. Liver:  Unremarkable. Spleen:  Unremarkable. Pancreas:  The questioned abrupt change in caliber on the April 2020 [place] is revealed to be tortuosity, without an apparent obstructing mass or stone. The pancreatic duct remains mildly, nonspecifically distended, up to 7 mm in diameter, but the gland is otherwise unremarkable. Gallbladder:  Unremarkable. Kidneys: Enhance symmetrically, without findings to suggest obstruction or mass. Adrenal glands/lymph nodes:  No adrenal mass or apparent lymphadenopathy. Visualized bowel:  No finding to suggest obstruction or surrounding inflammation. Gynecologic structures:  Limited evaluation of the ovaries shows simple-appearing cysts, measuring up to 2.6 cm on the left and 1.2 cm on the right. What appears to be an incidental 1.4 cm left para ovarian cyst is seen. The uterus is grossly unremarkable. Visualized bones:  No apparent aggressive abnormality. IMPRESSION: Benign appearing tortuosity of the pancreatic duct, but no apparent obstructing mass or stone. Signed By: Nelva Bush MD, 2019/04/16 16:32:20Us Abdomen CompleteResult Date: 1/6/2020[facility] [address] [place], [place] [address] [phone] US ABDOMEN COMPLETE ELFRIEDA ESPINO       Sex: Jobe Marker: [id number]  MRN: [id number] Service Date: 2018-05-05 21:47:18 US ABDOMEN COMPLETE CLINICAL INDICATION: lesion in head of pancreas PROCEDURE: High resolution gray-scale imaging and color-flow Doppler imaging of the abdomen was performed. Comparison: [place] 05/05/2018 and ultrasound 01/01/2018 FINDINGS: The pancreas is heterogeneous in consistent with a history of pancreatitis. No mass identified. The liver is normal in size. The liver is echogenic consistent fatty infiltration with fatty sparing noted adjacent to the gallbladder. There is gallbladder sludge..  No focal hepatic mass is seen.  Flow within the main portal vein is hepatopedal.  Hepatic veins are patent.  The gallbladder is otherwise unremarkable, without evidence of gallstones or wall thickening. There is no pericholecystic fluid or sonographic Murphy's sign. The common duct is normal in caliber.  No intrahepatic ductal dilatation is seen.   The kidneys are unremarkable without evidence for hydronephrosis or renal calculi. The spleen, aorta and IVC are unremarkable. IMPRESSION: Heterogeneous pancreas  consistent the patient's history of pancreatitis. No mass noted within the pancreas. Fatty infiltration of the liver Sludge within the gallbladder. Signed By: Sheral Apley MD, 2018/05/06 09:04:02Us Abdomen CompleteResult Date: 9/3/2019[facility] [address] [place], [place] [address] [phone] US ABDOMEN COMPLETE ARZELIA HARVIN       Sex: Jobe Marker: [id number]  MRN: [id number] Service Date: 2018-01-01 08:39:38 US ABDOMEN COMPLETE CLINICAL INDICATION:   pancreatitis; wish to r/o gallstone etiology TECHNIQUE:   Ultrasound of the abdomen performed utilizing gray scale, color and spectral Doppler technique COMPARISON:   [place] scan dated 09/01/2017. FINDINGS: Examination is limited by body habitus. Aorta is limitedly evaluated. Mid to distal aorta is normal in caliber. Aortic bifurcation is obscured by bowel gas. The pancreas is limitedly evaluated without identifiable focal abnormality. Pancreatic duct measures 4 mm. The liver is suboptimally evaluated/partially obscured Right lobe of the liver measures 23.7 cm. Liver demonstrates diffuse increased echotexture suggesting hepatic steatosis. No focal discrete hepatic lesion is identified. Portal vein is patent with normal direction of flow. Hepatic veins are patent. Gallbladder is unremarkable without wall thickening, gallstones or sludge. Sonographic Murphy's sign was not elicited. Common duct measures 6 mm. No intrahepatic biliary ductal dilatation is identified. Right kidney measures 11.6 cm. Left kidney measures 11 cm. There is no hydronephrosis or solid suspicious renal lesion. Spleen measures 9.5 cm without focal abnormality. There is no ascites or pleural fluid. IMPRESSION: Exam limited by body habitus and bowel gas. Diffuse increased echotexture of the liver compatible with hepatic steatosis without focal hepatic lesion. Enlarged liver maximally 23.7 cm. Limitedly evaluated pancreas without focal abnormality. Mildly distended gallbladder but the gallbladder is otherwise unremarkable. Normal biliary system. Normal spleen. No ascites or pleural fluid. Location: [facility], [place] Signed By: Waverly Ferrari MD, 2018/01/01 09:22:12Ct Abdomen Pelvis Mardelle Matte Date: 6/20/2021[facility] [address] [place], [place] [address] [phone] [place] ABDOMEN PELVIS W IV CONTRAST VENITA WEYMER       SexCarmon Ginsberg  DOB: [id number]  MRN: [id number] Service Date: 2019-10-19 13:55:25 [place] of the abdomen and pelvis with nonionic intravenous contrast - oral contrast: No Radiation dose lowering was achieved by the use of an iterative reconstruction technique. HISTORY: Epigastric pain COMPARISON: 16 April 2019 [place] of the abdomen and 01 August 2018 [place] of the abdomen/pelvis. Lung bases: Mild bibasilar markings suggest subsegmental atelectasis or scarring. The heart is normal in size. Liver: A 3.5 cm subtle hypodense region along the anterolateral periphery of the lateral segment may represent focal fat deposition. Spleen: Unremarkable. Pancreas: The previously noted hypodensity in the head region has mildly increased in size, now measuring 2.5 x 1.5 x 1.9 cm (series 2, image 32 and series 601, images 37 - 39), compared with 2.0 cm in maximum diameter on the December 2020 [place]. There has been interval improvement in the amount of mesenteric infiltration along the pancreas, but the pancreatic duct is mildly more distended, now measuring 1.0 cm in transverse diameter, compared with 0.7 cm on that study. Gallbladder: Unremarkable. Kidneys: Enhance symmetrically, without findings to suggest obstruction or mass. Adrenals/lymph nodes: The left adrenal gland remains minimally full. No right adrenal mass or apparent lymphadenopathy. Bowel: Hyperdense material within the bowel likely correlates with Pepto-Bismol. No finding to suggest obstruction or surrounding inflammation. Normal appendix. Mesentery/vessels: A tiny umbilical hernia contains only fat. Gynecologic structures: The uterus is unremarkable. The left ovary now contains a 3.1 cm peripherally enhancing structure, potentially representing a resolving hemorrhagic cyst. What may represent the  right ovary is unremarkable. Bladder: Unremarkable. Bones: No apparent aggressive abnormality. IMPRESSION: Overall interval improvement in the peripancreatic mesenteric infiltration, but the head region hypodensity has mildly increased in size. Though this could represent a pseudocyst, malignancy could also have this appearance. Continued [place] or MRI surveillance in 6 months is recommended to confirm stability or monitor growth. Findings suggesting a 3.1 cm resolving left ovarian hemorrhagic cyst. Estimated radiation dose: 230.43 mGy.cm PQRS:	[id number] 	[id number] Signed By: Nelva Bush MD, 2019/10/19 14:48:02Ct Abdomen Pelvis Mardelle Matte Date: 4/2/2020[facility] [address] [place], [place] [address] [phone] [place] ABDOMEN PELVIS W IV CONTRAST CHARLSIE FLEEGER       SexCarmon Ginsberg  DOB: [id number]  MRN: [id number] Service Date: 2018-08-01 11:04:48 [place] of the abdomen and pelvis with nonionic intravenous contrast - oral contrast: No Radiation dose lowering was achieved by the use of an iterative reconstruction technique. HISTORY: upper abdominal pain, elevated Lipase, elevated WBC COMPARISON: 05 May 2018 [place] of the abdomen and pelvis. Lung bases: Minimal bibasilar markings suggest subsegmental atelectasis or scarring. The heart is normal in size. Liver: Unremarkable. Spleen: Unremarkable. Pancreas: There is now mild to moderate dilatation of the pancreatic duct in the neck/body region, up to 5 mm, with an abrupt change in caliber in the upper head (series 601, image 42). No discrete mass or hypodensity is seen in the region of this transition. The previously noted hypodensity more inferiorly in the head region appears larger, now measuring 13 mm in maximum diameter (series 601, image 40), though at least a portion of this appears cystic. There has been interval improvement in the infiltration of the peripancreatic mesentery, with the residual potentially representing scarring, though mild recurrent pancreatitis could have this appearance. Gallbladder: Unremarkable. Kidneys: Enhance symmetrically, without findings to suggest obstruction or mass. Adrenals/lymph nodes: No adrenal mass or lymphadenopathy. Bowel: No finding to suggest obstruction or surrounding inflammation. There are scattered colonic diverticula. Hyperdense material within the colon, appendix and rectum may represent Pepto-Bismol. The appendix is otherwise normal. Mesentery/vessels: A small umbilical hernia contains only fat. Incidental umbilical jewelry. Gynecologic structures: The uterus and what likely represent the ovaries are unremarkable. Bladder: Unremarkable. Bones: No apparent aggressive abnormality. IMPRESSION: Interval improvement in the previously noted pancreatitis, with the minimal residual potentially representing scarring. Clinical and laboratory correlation is needed. New proximal - mid pancreatic duct dilatation, with an abrupt truncation, of uncertain significance. Apparent interval enlargement of a possibly cystic hypodensity more inferiorly in the pancreatic head. A follow-up MRI of the abdomen without and with intravenous contrast/MRCP is recommended for further characterization. This recommended study does not need to be performed emergently. Estimated radiation dose: 241.06 mGy.cm U1324 Signed By: Nelva Bush MD, 2018/08/01 11:43:15Ct Abdomen Pelvis Mardelle Matte Date: 1/5/2020[facility] [address] [place], [place] [address] [phone] [place] ABDOMEN PELVIS W IV CONTRAST JACKQUELYN SUNDBERG       Sex: Carmon Ginsberg  DOB: [id number]  MRN: [id number] Service Date: 2018-05-05 10:22:48 PROCEDURE:  [place] ABDOMEN PELVIS W IV CONTRAST CLINICAL INDICATION: pancreatitis with elevated WBC PROCEDURE: The patient was given 90 ml of Omnipaque 350 IV and oral contrast. [place] of the abdomen and pelvis was performed from the dome of the diaphragm to the symphysis pubis utilizing a slice thickness of 5mm. DLP 239.93 mGy.cm. Adjustment of mA and/or kV was done according to patient size. COMPARISON:  09/01/2017 FINDINGS: Dependent density within the lung bases likely atelectasis. There is evidence of fatty infiltration of the liver. Inflammatory changes are noted around the  pancreas consistent with pancreatitis without abscess or pseudocyst. There is a low-density lesion within the head of the pancreas of uncertain etiology. Attention to this on followup. This was not seen on previous. There is atherosclerotic disease. There are degenerative changes of the spine. The liver, spleen, adrenal glands, pancreas, kidneys, great vessels, gallbladder and bowel are otherwise unremarkable. The ureters, bladder, internal genitalia and bowel appear unremarkable. The appendix is unremarkable.  Scans through the lower lung fields are otherwise unremarkable. There is no significant abdominal or pelvic adenopathy. IMPRESSION: Acute pancreatitis. New low density lesion within the head of the pancreas. Attention to this on followup. Signed By: Sheral Apley MD, 2018/05/05 10:47:43Mri Abdomen W Ilda Basset Iv Contrast MrcpResult Date: 5/4/2021MRI ABDOMEN W WO IV CONTRAST MRCP Clinical indications: Pancreatic cyst/pseudocyst dilated PD 7mm Comparison: [place] dated April 16, 2019. TECHNIQUE: Multiplanar, multi sequential MR imaging of the abdomen was obtained with and without intravenous contrast. 12 cc of intravenous Dotarem was given. Coronal 3-D MRCP images were obtained FINDINGS: Irregular dilatation of the main pancreatic duct and pancreatic side are redemonstrated, with the main pancreatic duct measuring up to 0.7 cm, compatible with chronic pancreatitis. Previously seen superimposed findings of acute pancreatitis have resolved. On image 51, series 29528, there is a 1.6 x 1.2 cm cystic lesion associated with the pancreatic head, presumably a pseudocyst, similar to the prior exam. No new collections are identified. The common bile duct remains mildly dilated measuring up to 7 mm, with trace intrahepatic biliary prominence again noted. The spleen, kidneys, adrenal glands, liver and gallbladder are unremarkable with the exception of a right renal cyst. There is no abdominal adenopathy or ascites. The hepatic vasculature is patent. IMPRESSION: Stable collection associated with the pancreatic head has described. Reported And Signed By: Liliane Channel, MDMri Abdomen W Ilda Basset Iv Contrast MrcpResult Date: 4/3/2020[facility] [address] [place], [place] [address] [phone] MRI ABDOMEN W WO IV CONTRAST MRCP ANDRIKA PERAZA       Sex: F  DOB: [id number]  MRN: [id number] Service Date: 2018-08-02 11:44:18 MRI abdomen - pancreas/MRCP protocol CLINICAL INDICATION:   pancreatitis, pancreatic cystic lesion, pancreatic duct dilatation TECHNIQUE:   Multiplanar T1-weighted and T2-weighted magnetic resonance images were obtained high field strength (1.5 Tesla). Pre and postcontrast images were obtained and the patient received 14 ml of intravenous Dotarem for the exam. 3D/MIP reformatted images were constructed on a dependent workstation and stored per protocol. COMPARISON:   [place] 08/01/2018, ultrasound 05/05/2018, [place] 05/05/2018, [place] 09/01/2017 FINDINGS: There is high T2 signal surrounding the pancreas particularly the head and uncinate process with small free fluid of the retroperitoneum identified. There is high T2 signal lesion identified in the head/uncinate process location which corresponds to low density lesion seen on recent [place] measuring 1.0 cm in size. Definitively demonstrate significant enhancement. There is mild wall thickening of the adjacent duodenum as well. There is dilatation of the main pancreatic duct identified which measures 0.7 cm in greatest axial dimensions. The common bile duct measures 0.6 cm in size with no obvious filling defect of the common bile duct noted. IMPRESSION: In the region of the head/uncinate process there is a lesion of the pancreas which may represent a pseudocyst/cystic lesion and is nonspecific. Recommend clinical correlation and follow-up. Follow-up MRI in 3 months may be useful to further assess this finding. Findings are suspicious for acute pancreatitis with possible secondary duodenitis with dilatation of the main pancreatic duct identified with no obvious filling defect of the main pancreatic duct or the  common bile duct however the common bile duct size appears at the upper limits of normal for size. Other findings as noted above. Signed By: Chevis Pretty MD, 2018/08/02 12:55:10Us Abdomen LimitedResult Date: 1/15/2021[facility] [address] [place], [place] [address] [phone] [place] ABDOMEN LIMITED IMANIE DARROW       Sex: Jobe Marker: [id number]  MRN: [id number] Service Date: 2019-05-16 17:27:11 Limited abdomen ultrasound - right upper quadrant CLINICAL INDICATION:   Elevated lipase. Right upper quadrant pain. History of pancreatitis. COMPARISON:   [place] 04/16/2019, MRI 10/21/2018, MRI 08/02/2018, [place] 08/01/2018, 05/05/2018, 05/05/2018 FINDINGS: Liver:  14.7 cm, homogeneous echotexture. Pancreas:  Not well seen on this exam. There is a area of hypo echogenicity vaguely identified measuring 2.4 x 2.0 x 1.9 cm. This is seen in the region of the head of the pancreas. Gallbladder:  There is no sonographic Murphy's sign, significant wall thickening or stones in the gallbladder. Common bile duct:  0.8 cm Right Kidney:  11.8 cm, no hydronephrosis IMPRESSION: The pancreatic head and uncinate process are vaguely identified and there appears to be a mass-like density seen in this location which corresponds to [place] findings and findings may suggest acute pancreatitis however underlying mass is also possible given MRI findings of 10/21/2018 and additional workup is recommended.  Pseudocyst is also possible. Common bile duct dilatation identified and this could be further assessed on MRI of the pancreas with MRCP protocol as well. Other findings as above. Signed By: Chevis Pretty MD, 2019/05/16 18:05:03Mammography Screening Tomo BilateralResult Date: 12/30/2020[facility] [address] [place], [place] [address] [phone] MAMMO SCREENING [facility] BILATERAL CHANTE MAYSON       Sex: Carmon Ginsberg  DOB: [id number]  MRN: [id number] Service Date: 2019-04-29 [id number] BILATERAL TOMOGRAM CLINICAL INDICATION:  Screening exam COMPARISON EXAM(S): Bilateral tomogram 07/25/2016 TECHNIQUE: Bilateral breast tomosynthesis was performed in MLO and CC projections. Computer aided detection also employed. FINDINGS: The breasts demonstrate scattered fibroglandular density. A few small benign-appearing calcifications are seen scattered bilaterally. There is a new round mildly heterogeneous asymmetry seen in the lateral aspect mid right CC imaging was appears to correspond to a less conspicuous asymmetry in the anterior inferior aspect of right MLO imaging. No new distinct nodule, clustering of suspicious appearing calcifications, or architectural distortion noted. No abnormal skin thickening noted. IMPRESSION: New right breast asymmetry RECOMMENDATION: Spot compressed tomography. Ultrasound should be predicated on results of spot compression. BI-RADS CATEGORY: 0 Additional imaging evaluation and/or comparison to prior mammograms is needed. 3340F 7025F :L: Rt :A: Incomplete :R: Additional V :D: Scattered Signed By: Herma Ard MD, 2019/04/30 15:34:44Mammography Diagnostic Tomo RightResult Date: 2/16/2021[facility] [address] [place], [place] [address] [phone] [facility] DIAGNOSTIC [facility] RIGHT LINNELL SWORDS       Sex: Carmon Ginsberg  DOB: [id number]  MRN: [id number] Service Date: 2019-06-17 [id number] EXAM: [facility] DIAGNOSTIC TOMO RIGHT ACCESSION: [id number] EXAM DATE AND TIME: 06/17/2019 2:17 PM HISTORY: right breast asymmetry IMAGING: Spot compression CC/ML and full ML tomosynthesis views. COMPARISON(S): 29 April 2019, 30 May 2017 and 25 July 2016. TISSUE DENSITY: B: There are scattered areas of fibroglandular density. FINDINGS: The small, approximately 1 cm nodular asymmetry in question, best seen in the posterolateral aspect of the recent screening CC views, approximately 7 cm from the nipple, only questionably persists with additional compression. The overall resulting appearance suggests an aggregation of tiny nodules or cysts. No new finding. IMPRESSION: Questionable persistence of the nodular asymmetry in question. The region of this asymmetry was further evaluated with  ultrasound, dictated separately. That study showed no sonographically worrisome structure in the region of mammographic concern. Routine mammographic surveillance is recommended. BI-RADS: Category 2: Benign RECOMMENDATION(S): Routine screening mammogram in 1 year. COMMENTS: :L: Rt :A: Benign :R: Mamm 1 Yr :D: Scattered 3341F, 7025F Signed By: Nelva Bush MD, 2019/06/17 15:33:12Us Breast Right LimitedResult Date: 2/16/2021[facility] [address] [place], [place] [address] [phone] US BREAST RIGHT LIMITED SHARMEL BALLANTINE       Sex: F  DOB: [id number]  MRN: [id number] Service Date: 2019-06-17 [id number] Limited right breast ultrasound HISTORY: Questionably persistent nodular asymmetry on today's mammogram. Ultrasound characterization needed. Scanning was performed in the lower outer 8 o'clock region, correlating with mammograms performed today and on 29 April 2027. No discrete sonographic abnormality is identified to correlate with the mammographic asymmetry in question. Two adjacent small, 7 mm simple appearing cysts are noted in the 9 o'clock retro areolar region. A tiny, 4 mm simple appearing cyst is noted in the 10 o'clock region approximately 3 cm from the nipple. IMPRESSION: No sonographic abnormality in the region of the questionably persistent nodular asymmetry. Routine mammographic follow-up is recommended. BIRADS: 2, benign Routine mammographic follow-up :L: Rt :A: Neg :R: Mamm 1 Yr Signed By: Nelva Bush MD, 2019/06/17 15:31:59Cta Chest (pe) Mardelle Matte Date: 9/4/2019[facility] [address] [place], [place] [address] [phone] CTA CHEST (PE) W IV CONTRAST TYJANAE BARTEK       SexCarmon Ginsberg  DOB: [id number]  MRN: [id number] Service Date: 2018-01-02 16:34:13 [place] CHEST- [id number] CLINICAL INDICATION: CP or SOB, high pretest probability PE COMPARISONS: 09/01/2017, 12/10/2008, 10/27/2008, 04/30/2008, 08/29/2007, ultrasound abdomen 01/01/2018 PROTOCOL:  Contiguous axial images were obtained from the neck base to the upper abdomen subsequent to 90 ml of Omnipaque 350 intravenously administered. 3D/MIP reformatted images were constructed on a dependent workstation and stored per protocol. FINDINGS: Neck Base:  Normal Lung:  Respiratory motion artifact limits evaluation of the lung parenchyma. In the dependent and basilar portions of the lungs there appear to be patchy opacities identified particularly at the lung bases. There is no pneumothorax seen. Cardiovascular Structures:  There is limited evaluation of the pulmonary arteries for pulmonary emboli given degree of motion artifact and parenchymal findings detailed above with no definite large central pulmonary emboli identified. Lymph Nodes:  There is small nonspecific lymph nodes scattered throughout the chest and the upper abdomen. Other Mediastinal Findings:  As above Upper Abdomen:  Limitedly evaluated on this exam and there is findings suggestive of hepatomegaly however the liver is not fully imaged and diffuse fatty infiltrative changes of the liver suspected. There appears to be infiltrative changes surrounding the portions of the distal body and tail of the pancreas with free fluid identified. Soft tissue/musculoskeletal: There is spondylosis identified. There is kyphosis seen. IMPRESSION: Limited evaluation for pulmonary emboli with no definite pulmonary emboli identified. There is patchy basilar opacities which may suggest atelectasis particularly at the lung bases but also seen in the dependent portions of the lung with respiratory motion artifact. Findings are suspicious for acute pancreatitis with free fluid of the abdomen and extending into the retroperitoneum but this is only partially imaged on this exam. Findings are also suspicious for hepatomegaly with diffuse fatty infiltrative changes of the liver. This is only partially imaged on this exam. Other findings as noted above. Radiation dose acquired during scan: 183.72 mGy.cm [employer] strives for high quality imaging with the lowest possible radiation dose.  For more information  on medical radiation exposure please visit:  [email or web address] . Signed By: Chevis Pretty MD, 2018/01/02 16:59:09 Assessment/Plan: Natasha English is a 45 y.o. female being interviewed today for 1. Chronic pancreatitis, unspecified pancreatitis type (HC Code)  lipase-protease-amylase (CREON) 24,000-76,000 -120,000 unit per delayed release capsule 2. Acute pancreatitis, unspecified complication status, unspecified pancreatitis type   3. Pseudocyst of pancreas   MRI shows stable panc cyst and overall stable pancreas disease.  Risk of any endoscopic intervention at this time outweighs any benefit.-Recommendations:1. Recommend pt drink more water regularly.  She should aim for 1+ liter of water during the workday.  2. Due to continued weight loss and loose stools, will restart creon 24 2 with meals 90 day supply via [employer] mail order.3. Recommend she keep track of which foods bother her most and avoid-- can try to use coconut oil instead of other fats as it is usually better tolerated in pancreas disease.4. Follow up in telehealth in 3 months or sooner PRN to check on weight, stools and creon use.-Orders placed during this visit:Restart creonFollow up telehealth in 3 months (Nov 2021) with Floreen Comber APRN For any imaging tests, I have advised the patient to call [phone] for the diagnostic imaging scheduling line to set up the appointment for the imaging.  Thank you for the kind referral.  We greatly appreciate the opportunity to assist in your patient's care.  If there are further questions, please do not hesitate to contact us.Best Regards,Electronically Signed by Escher Harr [place] Faithanne Verret, APRN, August 13, 2021[facility] APRN MSN/MPHNurse Practitioner Clinical CoordinatorAdvanced Endoscopy & Pancreatic DiseasesDigestive [facility] [employer] of MedicineTel: [phone] Fax: [phone]

## 2019-12-15 ENCOUNTER — Encounter: Admit: 2019-12-15 | Payer: PRIVATE HEALTH INSURANCE

## 2020-03-12 ENCOUNTER — Ambulatory Visit: Admit: 2020-03-12 | Payer: PRIVATE HEALTH INSURANCE | Attending: Family

## 2020-03-12 ENCOUNTER — Encounter: Admit: 2020-03-12 | Payer: PRIVATE HEALTH INSURANCE | Attending: Family

## 2020-03-12 DIAGNOSIS — K859 Acute pancreatitis without necrosis or infection, unspecified: Secondary | ICD-10-CM

## 2020-03-12 DIAGNOSIS — R569 Unspecified convulsions: Secondary | ICD-10-CM

## 2020-03-12 DIAGNOSIS — Z5189 Encounter for other specified aftercare: Secondary | ICD-10-CM

## 2020-03-12 DIAGNOSIS — I2699 Other pulmonary embolism without acute cor pulmonale: Secondary | ICD-10-CM

## 2020-03-12 DIAGNOSIS — I10 Essential (primary) hypertension: Secondary | ICD-10-CM

## 2020-03-12 DIAGNOSIS — K861 Other chronic pancreatitis: Secondary | ICD-10-CM

## 2020-03-12 DIAGNOSIS — K862 Cyst of pancreas: Secondary | ICD-10-CM

## 2020-03-12 DIAGNOSIS — I4729 Paroxysmal ventricular tachycardia (HC Code): Secondary | ICD-10-CM

## 2020-03-12 DIAGNOSIS — E7800 Pure hypercholesterolemia, unspecified: Secondary | ICD-10-CM

## 2020-03-12 DIAGNOSIS — Z8742 Personal history of other diseases of the female genital tract: Secondary | ICD-10-CM

## 2020-03-12 MED ORDER — DEXTROAMPHETAMINE-AMPHETAMINE ER 30 MG 24HR CAPSULE,EXTEND RELEASE
30 mg | Freq: Every morning | ORAL | Status: AC
Start: 2020-03-12 — End: ?

## 2020-03-12 NOTE — Progress Notes
VIDEO TELEHEALTH VISIT: This clinician is part of the telehealth program and is conducting this visit in a currently approved location. For this visit the clinician and patient were present via interactive audio & video telecommunications system that permits real-time communications.Consent was signed via the Patient Acknowledgement and Financial Authorization Form and the Ambulatory Telehealth Consent Form. State patient is located in: [place] clinician is appropriately licensed in the above state to provide care for this visit. Other individuals present during the telehealth encounter and their role/relation: noneBecause this visit was completed over video, a hands-on physical exam was not performed.  Patient/parent or guardian understands and knows to call back if condition changes.[place] [facility]Phone: [phone]: [phone]: 03/12/2020 Patient referred to [facility] for consultation by: Referring, NoNo address on fileReason for Referral:   ICD-10-CM  1. Chronic pancreatitis, unspecified pancreatitis type (HC Code) (HC CODE)  K86.1  2. Tobacco dependence  F17.200  3. Pseudocyst of pancreas  K86.3   HPI: Natasha English is a 44 y.o. female being interviewed today due to 1. Chronic pancreatitis, unspecified pancreatitis type (HC Code) (HC CODE)   2. Tobacco dependence   3. Pseudocyst of pancreas    I have reviewed the patient's referral records, including past medical history, past physical exams, pertinent labs, imaging and prior plans of care.  Pt seen in follow up today for her chronic pancreatitis. She is doing very well.At last visit 3 mo ago, we restarted her creon.  This has considerably improved things for her.  Her weight is no longer decreasing.  Her stool are back to normal.  She has no pain or nausea.  She is overall feeling very well.Medical History: PMH PSH Past Medical History: Diagnosis Date ? Encounter for blood transfusion   during 2nd pregnancy ? Essential hypertension 10/12/2009 ? High cholesterol  ? History of abnormal cervical Papanicolaou smear  ? Pancreatic cyst 07/2018 ? Pancreatitis  ? Paroxysmal ventricular tachycardia (HC Code) (HC CODE) 10/12/2009  RT OUTFLOW TRACT/ ablations x 6 ? Pulmonary embolism (HC CODE) 2008  no blood thinners at this time ? Seizures (HC Code) (HC CODE)   during pregnancy  Past Surgical History: Procedure Laterality Date ? CARDIAC ELECTROPHYSIOLOGY MAPPING AND ABLATION  04/23/2008  [place] ? CERVICAL CONE BIOPSY   ? CESAREAN SECTION   ? MIBI Exercise stress/test (NM)  10/08/2007  [facility] ? MOUTH SURGERY   ? TONSILLECTOMY   ? TUBAL LIGATION   ? UPPER GASTROINTESTINAL ENDOSCOPY    Social History Family History Social History Tobacco Use ? Smoking status: Current Some Day Smoker   Packs/day: 0.50   Types: Cigarettes ? Smokeless tobacco: Never Used Substance Use Topics ? Alcohol use: Not Currently   Comment: daily 2-3 drinks/day  Family History Problem Relation Age of Onset ? Heart disease Father       Myocardial Infarction ? No Known Problems Mother  ? Breast cancer Maternal Grandmother   Medications Current Outpatient Medications (AUTONOMIC DRUGS) Medication Sig ? dextroamphetamine-amphetamine Take 30 mg by mouth every morning. Current Outpatient Medications (CARDIOVASCULAR) Medication Sig ? atorvastatin Take 20 mg by mouth daily.. Current Outpatient Medications (CNS DRUGS) Medication Sig ? lamoTRIgine Take 200 mg by mouth daily.  Current Outpatient Medications (PSYCHOTHERAPEUTIC DRUGS) Medication Sig ? buPROPion SR Take 150 mg by mouth 2 (two) times daily.  Current Outpatient Medications (GASTROINTESTINAL) Medication Sig ? Creon Take 2 capsules by mouth 3 (three) times daily with meals. ? pantoprazole Take 1 tablet (40 mg total) by mouth every  12 (twelve) hours. Current Outpatient Medications (PRE-NATAL VITAMINS) Medication Sig ? Prenatal Vitamin Plus Low Iron Take 1 tablet by mouth daily. *OTC   Allergies Allergies Allergen Reactions ? Lisinopril Swelling   Angioedema on May 11, 2018 ? Hydrochlorothiazide Other (See Comments)   Electrolyte imbalance  Family History: non-contributoryReview of Systems:  Pt reports any of the following within the last 2 weeks: [x] Denies any symptoms other than mentioned in HPI  [] Patient unable to provide meaningful ROS information because of language or cognitive limitations. [] Unintended weight loss >5lbs []  Unintended weight gain >5lbs [] Poor appetite [] Feels full quickly when eating [] Fevers/chills [] Feels tired sooner than others [] Jaundice [] Yellowing of eyes [] Sore throat [] Hoarseness or weak voice [] Shortness of breath [] Chronic cough [] Chest pain or discomfort:       [] Burning; [] Non-burning [] Palpitations [] Difficulty swallowing [] Food or liquid come back up to the throat:     [] Food; [] Liquid [] Hiccups [] Belching/burping [] Nausea or vomiting:     [] Nausea; [] Vomiting [] Abdominal distension or bloating [] Abdominal pain or discomfort level_____/10 on 0-10 scale.  [] Diarrhea [] Constipation [] Fecal incontinence (loss of control of stool) [] Rectal bleeding [] Blood clots in arms or legs     [] Arms; [] Legs [] Swelling of arms or legs     [] Arms; [] Legs []  Use of Blood Thinner other than aspirin []  Issues with Anesthesia in past [] Difficulty or pain during urination [] Pain in the:     [] Muscles [] Joints [] Skin rashes [] Easy bleeding or bruising [] Frequent or Severe Headaches [] Dizziness [] Problems with balance [] Loss of consciousness [] Tingling or weakness in arms or legs     [] Arms; [] Legs [] Seizures [] Problem with      [] Memory [] Paying attention [] Problems with [] Anxiety [] Depression Objective: Limited physical exam was performed due to telehealth visit. There were no vitals filed for this visit.  Physical Exam:Weight: Wt Readings from Last 1 Encounters: 03/12/20 61.2 kg  Gen: pleasant in NADRespirations: normal effortNeuro: AxOx3 Skin shows no signs of jaundiceLabs:Lipase Date Value Ref Range Status 10/20/2019 1,088 (H) 73 - 393 U/L Final Total Bilirubin Date Value Ref Range Status 10/20/2019 0.4 <1.0 mg/dL Final   Comment:   Use of this assay is not recommended for patients undergoing treatment with Eltrombopag due to the potential for falsely elevated results.  10/19/2019 0.2 <1.0 mg/dL Final   Comment:   Use of this assay is not recommended for patients undergoing treatment with Eltrombopag due to the potential for falsely elevated results.  08/28/2019 0.1 <1.0 mg/dL Final   Comment:   Use of this assay is not recommended for patients undergoing treatment with Eltrombopag due to the potential for falsely elevated results.  07/31/2019 0.3 <1.0 mg/dL Final   Comment:   Use of this assay is not recommended for patients undergoing treatment with Eltrombopag due to the potential for falsely elevated results.  Bilirubin, Direct Date Value Ref Range Status 10/19/2019 0.07 0.00 - 0.20 mg/dL Final 16/01/9603 5.40 9.81 - 0.20 mg/dL Final 19/14/7829 5.62 1.30 - 0.20 mg/dL Final 86/57/8469 6.29 5.28 - 0.20 mg/dL Final Aspartate Aminotransferase (AST) Date Value Ref Range Status 10/20/2019 10 (L) 15 - 37 U/L Final 10/19/2019 12 (L) 15 - 37 U/L Final 08/28/2019 12 (L) 15 - 37 U/L Final 07/31/2019 9 (L) 15 - 37 U/L Final Alanine Aminotransferase (ALT) Date Value Ref Range Status 10/20/2019 15 13 - 56 U/L Final 10/19/2019 18 13 - 56 U/L Final 08/28/2019 19 13 - 56 U/L Final 07/31/2019 20 13 - 56 U/L Final Alkaline Phosphatase Date Value Ref Range Status 10/20/2019 80 45 -  117 U/L Final 10/19/2019 77 45 - 117 U/L Final 08/28/2019 90 45 - 117 U/L Final 07/31/2019 96 45 - 117 U/L Final GGT Date Value Ref Range Status 08/03/2018 40 5 - 55 U/L Final 01/07/2018 396 (H) 5 - 55 U/L Final Albumin Date Value Ref Range Status 10/20/2019 3.5 3.4 - 5.0 g/dL Final 57/84/6962 4.0 3.4 - 5.0 g/dL Final 95/28/4132 3.7 3.4 - 5.0 g/dL Final 44/05/270 4.4 3.4 - 5.0 g/dL Final  No results found for: IGGResults for orders placed or performed during the hospital encounter of 11/21/19 COVID-19 Clearance or for Placement Only  Specimen: Nasopharynx; Viral Result Value Ref Range  SARS-CoV-2 RNA (COVID-19) Negative Negative Imaging:XR Chest PA or APResult Date: 4/1/2021[facility] [address] [place], [place] [address] [phone] XR CHEST PA OR AP [place]       Sex: Jobe Marker: [id number]  MRN: [id number] Service Date: 2019-07-31 09:44:39 CHEST - SINGLE VIEW CLINICAL INDICATION: r/o effusion, acute pancreatitis COMPARISON:  01/02/2018 TECHNIQUE: A portable upright view of the chest was performed at 09:46 a.m. FINDINGS:  The lungs are clear. The heart and mediastinum are within normal limits. Visualized bones are intact. IMPRESSION:  No acute process is seen. Signed By: Ulyses Amor MD, 2019/07/31 12:37:57CT Head wo IV ContrastResult Date: 4/1/2021[facility] [address] [place], [place] [address] [phone] [place] HEAD WO IV CONTRAST MAEBEL MARASCO       Sex: Carmon Ginsberg  DOB: [id number]  MRN: [id number] Service Date: 2019-07-31 10:54:09 [place] HEAD WO IV CONTRAST CLINICAL INDICATION:   Vertigo, central TECHNIQUE:   [place] scan of the head without contrast. Coronal and sagittal reformatted images available for review. S2831 - Radiation dose acquired during scan:  644.98 mGy.cm . The [employer] strives for high quality imaging with the lowest possible radiation dose.  For more information on medical radiation exposure please visit:  [email or web address] . COMPARISON:   None FINDINGS: Scout view demonstrates no focal abnormality. 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 Brain stem and posterior fossa/cerebellum are unremarkable. Gray-white differentiation is intact. There is no infarct or mass. No parenchymal hemorrhage or extra-axial collection is identified. No discrete white matter lesion is identified. Ventricles, cisterns and sulci are normal. No sellar or suprasellar lesion is identified. There is no Chiari malformation. Midline structures are normal. Mastoid air cells and middle ear cavities are clear. Left to right nasal septal deviation is identified. Globes and orbits are unremarkable. IMPRESSION: No acute intracranial abnormality. No infarct, mass, parenchymal hemorrhage or extra-axial collection. No discrete white matter lesion. 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 Location: [place], [place] Signed By: Waverly Ferrari MD, 2019/07/31 11:06:00CT Abdomen w wo IV ContrastResult Date: 12/16/2020[facility] [address] [place], [place] [address] [phone] [place] ABDOMEN W WO IV CONTRAST SARAIAH BHAT       SexCarmon Ginsberg  DOB: [id number]  MRN: [id number] Service Date: 2019-04-16 14:55:47 [place] of the abdomen without and with nonionic intravenous contrast - oral contrast: Yes Radiation dose lowering was achieved by the use of an iterative reconstruction technique. HISTORY: Pancreatitis suspected COMPARISON: 21 October 2018 and 02 August 2018 MRI studies of the abdomen, and 01 August 2018, 05 May 2018 and 01 Sep 2017 [place] studies of the abdomen/pelvis. Pre IV contrast:  No apparent visceral calcification or finding to suggest hemorrhage. Lung bases: Clear. The heart is normal in size.  Liver: Unremarkable. Spleen: Unremarkable. Pancreas: There is mild infiltration of the mesenteric fat along the head and body region, consistent with pancreatitis. The previously noted rounded hypodensity in the head region, potentially representing a pseudocyst, now measures 2.0 cm in maximum diameter, compared with 1.8 cm on the June 2020 and 1.5 cm on the April 2020 MRI studies. The pancreatic duct remains moderately distended. Gallbladder: Unremarkable. Kidneys: Enhance symmetrically, without findings to suggest obstruction or mass. Adrenals/lymph nodes: No adrenal mass or lymphadenopathy. Mesentery/vessels: A small umbilical hernia contains only fat. Skin/bones/subcutaneous tissues: No apparent aggressive bony abnormality. Visualized bowel and pelvis: Unremarkable. What appears to be the partially visualized appendix appears normal. IMPRESSION: Findings consistent with mild, uncomplicated pancreatitis. Mild interval enlargement of a pancreatic head hypodensity, potentially representing a pseudocyst. A follow-up endoscopic ultrasound and biopsy should be considered because of the possibility of malignancy. Alternatively, a follow-up MRI of the abdomen without and with intravenous contrast, focusing on the pancreas, may be helpful. Estimated radiation exposure: 422.79 mGy.cm [id number] Signed By: Nelva Bush MD, 2019/04/16 16:21:21MRI Abdomen w wo IV ContrastResult Date: 8/10/2021MRI ABDOMEN W WO IV CONTRAST HISTORY: Pancreatic cyst. COMPARISON: MRI abdomen 09/18/2019; [place] scan abdomen/pelvis 10/19/2019 TECHNIQUE:  Multiplanar multi sequence imaging of the abdomen was obtained before and after intravenous contrast administration (14 cc Dotarem). MRCP images were obtained prior to contrast administration.  Those images were reformatted as a 3D sequence by the MRI technologist at the dependent workstation.  The reformatted 3D images were saved to the PACS system. FINDINGS: The gallbladder wall does not appear thickened. There is slight common bile duct enlargement, measuring up to 0.8 cm (previously 0.7 cm). Pancreatic ductal dilatation is similar, measuring up to 0.7 cm. A 1.6 x 1.2 cm cystic lesion in the head of the pancreas is similar in overall dimensions but now has a less lobular wall. No associated enhancement is evident. Mild edema is seen along the head of the pancreas and proximal duodenum. The liver and spleen are not enlarged. A wedge-shaped area of more conspicuous enhancement anterolaterally in the right hepatic lobe is similar, likely perfusional. Portal, splenic, and superior mesenteric venous patency is maintained. No adrenal lesion is seen. There is no hydronephrosis. No free fluid is seen.   Compared to May 2021: Similar size of pancreatic head cystic lesion which now demonstrates less lobular contours. Mild edema along head of pancreas and proximal duodenum. Correlate for symptomatology reflective of acute pancreatitis. Slight common bile duct prominence, slightly more so than before. Reported and signed by:  Jolyn Lent, MD MRI Abdomen w wo IV ContrastResult Date: 12/16/2020[facility] [address] [place], [place] [address] [phone] MRI ABDOMEN W WO IV CONTRAST LATERIA ALDERMAN       SexCarmon Ginsberg  DOB: [id number]  MRN: [id number] Service Date: 2018-10-21 10:36:38 Addendum created at 04/16/2019 4:27:19 PM: ADDENDUM: Upon further review, there is a 1.8 cm lobulated focus of abnormal enhancement in the pancreatic head, similar in location to the abnormality noted on the April 2020 MRI. Though not specifically measured on that study, the corresponding focus of hypo enhancement measured 1.5 cm in maximum diameter on that study. Revised IMPRESSION: Mild interval enlargement of a pancreatic head abnormality. Please see the 16 April 2019 [place] of the abdomen report for further details and recommendation. Addendum by: Audrie Gallus Coleman M.D. MRI of the abdomen without and with 14 ml intravenous Dotarem HISTORY: Follow-up pancreatic cyst. Standard sequences were supplemented by coronal and serial axial T1 weighted postgadolinium sequences. COMPARISON:  02 August 2018 MRI of the abdomen and 01 August 2018 [place] of the abdomen/pelvis. Lung bases:  Clear. Liver:  Unremarkable. Spleen:  Unremarkable. Pancreas:  The questioned abrupt change in caliber on the April 2020 [place] is revealed to be tortuosity, without an apparent obstructing mass or stone. The pancreatic duct remains mildly, nonspecifically distended, up to 7 mm in diameter, but the gland is otherwise unremarkable. Gallbladder:  Unremarkable. Kidneys: Enhance symmetrically, without findings to suggest obstruction or mass. Adrenal glands/lymph nodes:  No adrenal mass or apparent lymphadenopathy. Visualized bowel:  No finding to suggest obstruction or surrounding inflammation. Gynecologic structures:  Limited evaluation of the ovaries shows simple-appearing cysts, measuring up to 2.6 cm on the left and 1.2 cm on the right. What appears to be an incidental 1.4 cm left para ovarian cyst is seen. The uterus is grossly unremarkable. Visualized bones:  No apparent aggressive abnormality. IMPRESSION: Benign appearing tortuosity of the pancreatic duct, but no apparent obstructing mass or stone. Signed By: Nelva Bush MD, 2019/04/16 16:32:20US Abdomen CompleteResult Date: 1/6/2020[facility] [address] [place], [place] [address] [phone] US ABDOMEN COMPLETE QUINNLY CARLS       Sex: Jobe Marker: [id number]  MRN: [id number] Service Date: 2018-05-05 21:47:18 US ABDOMEN COMPLETE CLINICAL INDICATION: lesion in head of pancreas PROCEDURE: High resolution gray-scale imaging and color-flow Doppler imaging of the abdomen was performed. Comparison: [place] 05/05/2018 and ultrasound 01/01/2018 FINDINGS: The pancreas is heterogeneous in consistent with a history of pancreatitis. No mass identified. The liver is normal in size. The liver is echogenic consistent fatty infiltration with fatty sparing noted adjacent to the gallbladder. There is gallbladder sludge..  No focal hepatic mass is seen.  Flow within the main portal vein is hepatopedal.  Hepatic veins are patent.  The gallbladder is otherwise unremarkable, without evidence of gallstones or wall thickening. There is no pericholecystic fluid or sonographic Murphy's sign. The common duct is normal in caliber.  No intrahepatic ductal dilatation is seen.   The kidneys are unremarkable without evidence for hydronephrosis or renal calculi. The spleen, aorta and IVC are unremarkable. IMPRESSION: Heterogeneous pancreas consistent the patient's history of pancreatitis. No mass noted within the pancreas. Fatty infiltration of the liver Sludge within the gallbladder. Signed By: Sheral Apley MD, 2018/05/06 09:04:02CT Abdomen Pelvis w IV ContrastResult Date: 6/20/2021[facility] [address] [place], [place] [address] [phone] [place] ABDOMEN PELVIS W IV CONTRAST JOELLY BUTCHER       SexCarmon Ginsberg  DOB: [id number]  MRN: [id number] Service Date: 2019-10-19 13:55:25 [place] of the abdomen and pelvis with nonionic intravenous contrast - oral contrast: No Radiation dose lowering was achieved by the use of an iterative reconstruction technique. HISTORY: Epigastric pain COMPARISON: 16 April 2019 [place] of the abdomen and 01 August 2018 [place] of the abdomen/pelvis. Lung bases: Mild bibasilar markings suggest subsegmental atelectasis or scarring. The heart is normal in size. Liver: A 3.5 cm subtle hypodense region along the anterolateral periphery of the lateral segment may represent focal fat deposition. Spleen: Unremarkable. Pancreas: The previously noted hypodensity in the head region has mildly increased in size, now measuring 2.5 x 1.5 x 1.9 cm (series 2, image 32 and series 601, images 37 - 39), compared with 2.0 cm in maximum diameter on the December 2020 . There has been interval improvement in the amount of mesenteric infiltration along the pancreas, but the pancreatic duct is mildly more distended, now measuring 1.0 cm in transverse diameter, compared with 0.7 cm on that  study. Gallbladder: Unremarkable. Kidneys: Enhance symmetrically, without findings to suggest obstruction or mass. Adrenals/lymph nodes: The left adrenal gland remains minimally full. No right adrenal mass or apparent lymphadenopathy. Bowel: Hyperdense material within the bowel likely correlates with Pepto-Bismol. No finding to suggest obstruction or surrounding inflammation. Normal appendix. Mesentery/vessels: A tiny umbilical hernia contains only fat. Gynecologic structures: The uterus is unremarkable. The left ovary now contains a 3.1 cm peripherally enhancing structure, potentially representing a resolving hemorrhagic cyst. What may represent the right ovary is unremarkable. Bladder: Unremarkable. Bones: No apparent aggressive abnormality. IMPRESSION: Overall interval improvement in the peripancreatic mesenteric infiltration, but the head region hypodensity has mildly increased in size. Though this could represent a pseudocyst, malignancy could also have this appearance. Continued [place] or MRI surveillance in 6 months is recommended to confirm stability or monitor growth. Findings suggesting a 3.1 cm resolving left ovarian hemorrhagic cyst. Estimated radiation dose: 230.43 mGy.cm PQRS:	[id number] 	[id number] Signed By: Nelva Bush MD, 2019/10/19 14:48:02CT Abdomen Pelvis w IV ContrastResult Date: 4/2/2020[facility] [address] [place], [place] [address] [phone] [place] ABDOMEN PELVIS W IV CONTRAST RAMONITA CHEREK       SexCarmon Ginsberg  DOB: [id number]  MRN: [id number] Service Date: 2018-08-01 11:04:48 [place] of the abdomen and pelvis with nonionic intravenous contrast - oral contrast: No Radiation dose lowering was achieved by the use of an iterative reconstruction technique. HISTORY: upper abdominal pain, elevated Lipase, elevated WBC COMPARISON: 05 May 2018 [place] of the abdomen and pelvis. Lung bases: Minimal bibasilar markings suggest subsegmental atelectasis or scarring. The heart is normal in size. Liver: Unremarkable. Spleen: Unremarkable. Pancreas: There is now mild to moderate dilatation of the pancreatic duct in the neck/body region, up to 5 mm, with an abrupt change in caliber in the upper head (series 601, image 42). No discrete mass or hypodensity is seen in the region of this transition. The previously noted hypodensity more inferiorly in the head region appears larger, now measuring 13 mm in maximum diameter (series 601, image 40), though at least a portion of this appears cystic. There has been interval improvement in the infiltration of the peripancreatic mesentery, with the residual potentially representing scarring, though mild recurrent pancreatitis could have this appearance. Gallbladder: Unremarkable. Kidneys: Enhance symmetrically, without findings to suggest obstruction or mass. Adrenals/lymph nodes: No adrenal mass or lymphadenopathy. Bowel: No finding to suggest obstruction or surrounding inflammation. There are scattered colonic diverticula. Hyperdense material within the colon, appendix and rectum may represent Pepto-Bismol. The appendix is otherwise normal. Mesentery/vessels: A small umbilical hernia contains only fat. Incidental umbilical jewelry. Gynecologic structures: The uterus and what likely represent the ovaries are unremarkable. Bladder: Unremarkable. Bones: No apparent aggressive abnormality. IMPRESSION: Interval improvement in the previously noted pancreatitis, with the minimal residual potentially representing scarring. Clinical and laboratory correlation is needed. New proximal - mid pancreatic duct dilatation, with an abrupt truncation, of uncertain significance. Apparent interval enlargement of a possibly cystic hypodensity more inferiorly in the pancreatic head. A follow-up MRI of the abdomen without and with intravenous contrast/MRCP is recommended for further characterization. This recommended study does not need to be performed emergently. Estimated radiation dose: 241.06 mGy.cm [id number] Signed By: Nelva Bush MD, 2018/08/01 11:43:15CT Abdomen Pelvis w IV ContrastResult Date: 1/5/2020[facility] [address] [place], [place] [address] [phone] [place] ABDOMEN PELVIS W IV CONTRAST ADRITA MEYERHOFF       Sex: Carmon Ginsberg  DOB: [id number]  MRN: [id number] Service Date: 2018-05-05 10:22:48 PROCEDURE:  [place] ABDOMEN PELVIS W IV CONTRAST CLINICAL  INDICATION: pancreatitis with elevated WBC PROCEDURE: The patient was given 90 ml of Omnipaque 350 IV and oral contrast. [place] of the abdomen and pelvis was performed from the dome of the diaphragm to the symphysis pubis utilizing a slice thickness of 5mm. DLP 239.93 mGy.cm. Adjustment of mA and/or kV was done according to patient size. COMPARISON:  09/01/2017 FINDINGS: Dependent density within the lung bases likely atelectasis. There is evidence of fatty infiltration of the liver. Inflammatory changes are noted around the pancreas consistent with pancreatitis without abscess or pseudocyst. There is a low-density lesion within the head of the pancreas of uncertain etiology. Attention to this on followup. This was not seen on previous. There is atherosclerotic disease. There are degenerative changes of the spine. The liver, spleen, adrenal glands, pancreas, kidneys, great vessels, gallbladder and bowel are otherwise unremarkable. The ureters, bladder, internal genitalia and bowel appear unremarkable. The appendix is unremarkable.  Scans through the lower lung fields are otherwise unremarkable. There is no significant abdominal or pelvic adenopathy. IMPRESSION: Acute pancreatitis. New low density lesion within the head of the pancreas. Attention to this on followup. Signed By: Sheral Apley MD, 2018/05/05 10:47:43MRI Abdomen w wo IV Contrast MRCPResult Date: 5/4/2021MRI ABDOMEN W WO IV CONTRAST MRCP Clinical indications: Pancreatic cyst/pseudocyst dilated PD 7mm Comparison: [place] dated April 16, 2019. TECHNIQUE: Multiplanar, multi sequential MR imaging of the abdomen was obtained with and without intravenous contrast. 12 cc of intravenous Dotarem was given. Coronal 3-D MRCP images were obtained FINDINGS: Irregular dilatation of the main pancreatic duct and pancreatic side are redemonstrated, with the main pancreatic duct measuring up to 0.7 cm, compatible with chronic pancreatitis. Previously seen superimposed findings of acute pancreatitis have resolved. On image 51, series 11914, there is a 1.6 x 1.2 cm cystic lesion associated with the pancreatic head, presumably a pseudocyst, similar to the prior exam. No new collections are identified. The common bile duct remains mildly dilated measuring up to 7 mm, with trace intrahepatic biliary prominence again noted. The spleen, kidneys, adrenal glands, liver and gallbladder are unremarkable with the exception of a right renal cyst. There is no abdominal adenopathy or ascites. The hepatic vasculature is patent. IMPRESSION: Stable collection associated with the pancreatic head has described. Reported And Signed By: Liliane Channel, MDMRI Abdomen w wo IV Contrast MRCPResult Date: 4/3/2020[facility] [address] [place], [place] [address] [phone] MRI ABDOMEN W WO IV CONTRAST MRCP BRISTOL OSENTOSKI       Sex: F  DOB: [id number]  MRN: [id number] Service Date: 2018-08-02 11:44:18 MRI abdomen - pancreas/MRCP protocol CLINICAL INDICATION:   pancreatitis, pancreatic cystic lesion, pancreatic duct dilatation TECHNIQUE:   Multiplanar T1-weighted and T2-weighted magnetic resonance images were obtained high field strength (1.5 Tesla). Pre and postcontrast images were obtained and the patient received 14 ml of intravenous Dotarem for the exam. 3D/MIP reformatted images were constructed on a dependent workstation and stored per protocol. COMPARISON:   [place] 08/01/2018, ultrasound 05/05/2018, [place] 05/05/2018, [place] 09/01/2017 FINDINGS: There is high T2 signal surrounding the pancreas particularly the head and uncinate process with small free fluid of the retroperitoneum identified. There is high T2 signal lesion identified in the head/uncinate process location which corresponds to low density lesion seen on recent  measuring 1.0 cm in size. Definitively demonstrate significant enhancement. There is mild wall thickening of the adjacent duodenum as well. There is dilatation of the main pancreatic duct identified which measures 0.7 cm in greatest axial dimensions. The common bile duct measures 0.6 cm in  size with no obvious filling defect of the common bile duct noted. IMPRESSION: In the region of the head/uncinate process there is a lesion of the pancreas which may represent a pseudocyst/cystic lesion and is nonspecific. Recommend clinical correlation and follow-up. Follow-up MRI in 3 months may be useful to further assess this finding. Findings are suspicious for acute pancreatitis with possible secondary duodenitis with dilatation of the main pancreatic duct identified with no obvious filling defect of the main pancreatic duct or the common bile duct however the common bile duct size appears at the upper limits of normal for size. Other findings as noted above. Signed By: Chevis Pretty MD, 2018/08/02 12:55:10US Abdomen LimitedResult Date: 1/15/2021[facility] [address] [place], [place] [address] [phone] US ABDOMEN LIMITED MAGDIEL CHOMA       Sex: Jobe Marker: [id number]  MRN: [id number] Service Date: 2019-05-16 17:27:11 Limited abdomen ultrasound - right upper quadrant CLINICAL INDICATION:   Elevated lipase. Right upper quadrant pain. History of pancreatitis. COMPARISON:   [place] 04/16/2019, MRI 10/21/2018, MRI 08/02/2018, [place] 08/01/2018, 05/05/2018, 05/05/2018 FINDINGS: Liver:  14.7 cm, homogeneous echotexture. Pancreas:  Not well seen on this exam. There is a area of hypo echogenicity vaguely identified measuring 2.4 x 2.0 x 1.9 cm. This is seen in the region of the head of the pancreas. Gallbladder:  There is no sonographic Murphy's sign, significant wall thickening or stones in the gallbladder. Common bile duct:  0.8 cm Right Kidney:  11.8 cm, no hydronephrosis IMPRESSION: The pancreatic head and uncinate process are vaguely identified and there appears to be a mass-like density seen in this location which corresponds to [place] findings and findings may suggest acute pancreatitis however underlying mass is also possible given MRI findings of 10/21/2018 and additional workup is recommended.  Pseudocyst is also possible. Common bile duct dilatation identified and this could be further assessed on MRI of the pancreas with MRCP protocol as well. Other findings as above. Signed By: Chevis Pretty MD, 2019/05/16 18:05:03Mammography Screening Tomo BilateralResult Date: 12/30/2020[facility] [address] [place], [place] [address] [phone] MAMMO SCREENING [facility] BILATERAL VELMAR DEADRICK       Sex: Carmon Ginsberg  DOB: [id number]  MRN: [id number] Service Date: 2019-04-29 [id number] BILATERAL TOMOGRAM CLINICAL INDICATION:  Screening exam COMPARISON EXAM(S): Bilateral tomogram 07/25/2016 TECHNIQUE: Bilateral breast tomosynthesis was performed in MLO and CC projections. Computer aided detection also employed. FINDINGS: The breasts demonstrate scattered fibroglandular density. A few small benign-appearing calcifications are seen scattered bilaterally. There is a new round mildly heterogeneous asymmetry seen in the lateral aspect mid right CC imaging was appears to correspond to a less conspicuous asymmetry in the anterior inferior aspect of right MLO imaging. No new distinct nodule, clustering of suspicious appearing calcifications, or architectural distortion noted. No abnormal skin thickening noted. IMPRESSION: New right breast asymmetry RECOMMENDATION: Spot compressed tomography. Ultrasound should be predicated on results of spot compression. BI-RADS CATEGORY: 0 Additional imaging evaluation and/or comparison to prior mammograms is needed. 3340F 7025F :L: Rt :A: Incomplete :R: Additional V :D: Scattered Signed By: Herma Ard MD, 2019/04/30 15:34:44Mammography Diagnostic Tomo RightResult Date: 2/16/2021[facility] [address] [place], [place] [address] [phone] [facility] DIAGNOSTIC [facility] RIGHT TAEYA GARRETTE       Sex: Carmon Ginsberg  DOB: [id number]  MRN: [id number] Service Date: 2019-06-17 [id number] EXAM: [facility] DIAGNOSTIC TOMO RIGHT ACCESSION: [id number] EXAM DATE AND TIME: 06/17/2019 2:17 PM HISTORY: right breast asymmetry IMAGING: Spot compression CC/ML and full ML tomosynthesis views. COMPARISON(S): 29 April 2019,  30 May 2017 and 25 July 2016. TISSUE DENSITY: B: There are scattered areas of fibroglandular density. FINDINGS: The small, approximately 1 cm nodular asymmetry in question, best seen in the posterolateral aspect of the recent screening CC views, approximately 7 cm from the nipple, only questionably persists with additional compression. The overall resulting appearance suggests an aggregation of tiny nodules or cysts. No new finding. IMPRESSION: Questionable persistence of the nodular asymmetry in question. The region of this asymmetry was further evaluated with ultrasound, dictated separately. That study showed no sonographically worrisome structure in the region of mammographic concern. Routine mammographic surveillance is recommended. BI-RADS: Category 2: Benign RECOMMENDATION(S): Routine screening mammogram in 1 year. COMMENTS: :L: Rt :A: Benign :R: Mamm 1 Yr :D: Scattered 3341F, 7025F Signed By: Nelva Bush MD, 2019/06/17 15:33:12US Breast Right LimitedResult Date: 2/16/2021[facility] [address] [place], [place] [address] [phone] US BREAST RIGHT LIMITED BRYTNEE BECHLER       Sex: F  DOB: [id number]  MRN: [id number] Service Date: 2019-06-17 [id number] Limited right breast ultrasound HISTORY: Questionably persistent nodular asymmetry on today's mammogram. Ultrasound characterization needed. Scanning was performed in the lower outer 8 o'clock region, correlating with mammograms performed today and on 29 April 2027. No discrete sonographic abnormality is identified to correlate with the mammographic asymmetry in question. Two adjacent small, 7 mm simple appearing cysts are noted in the 9 o'clock retro areolar region. A tiny, 4 mm simple appearing cyst is noted in the 10 o'clock region approximately 3 cm from the nipple. IMPRESSION: No sonographic abnormality in the region of the questionably persistent nodular asymmetry. Routine mammographic follow-up is recommended. BIRADS: 2, benign Routine mammographic follow-up :L: Rt :A: Neg :R: Mamm 1 Yr Signed By: Nelva Bush MD, 2019/06/17 15:31:59 Assessment/Plan: Natasha English is a 44 y.o. female being interviewed today for 1. Chronic pancreatitis, unspecified pancreatitis type (HC Code) (HC CODE)   2. Tobacco dependence   3. Pseudocyst of pancreas   Pt is doing very well.No daily symptoms of pain, nausea or loose bowel movements.Pancreatic insufficiency controlled well with this dose of creon.Continue creonPseudocyst remains in pancreas head as of last MRI in summer 2021.Pt remains smoking cigarettes.  She is not ready to quit at this time.  She will follow up within 6-12 months with [place] or prn.  -Recommendations:-Orders placed during this visit:No orders of the defined types were placed in this encounter. For any imaging tests, I have advised the patient to call [phone] for the diagnostic imaging scheduling line to set up the appointment for the imaging.  Thank you for the kind referral.  We greatly appreciate the opportunity to assist in your patient's care.  If there are further questions, please do not hesitate to contact us.Best Regards,Electronically Signed by Floreen Comber APRN 03/12/2020 Floreen Comber APRN MSN/MPHNurse Practitioner Clinical CoordinatorAdvanced Endoscopy & Pancreatic DiseasesDigestive Gi [facility]oscopy Center of MedicineTel: [phone] Fax: [phone]

## 2020-03-13 DIAGNOSIS — K863 Pseudocyst of pancreas: Secondary | ICD-10-CM

## 2020-03-13 DIAGNOSIS — F17200 Nicotine dependence, unspecified, uncomplicated: Secondary | ICD-10-CM

## 2020-05-09 ENCOUNTER — Inpatient Hospital Stay: Admit: 2020-05-09 | Discharge: 2020-05-09 | Payer: BLUE CROSS/BLUE SHIELD

## 2020-05-09 DIAGNOSIS — Z20828 Contact with and (suspected) exposure to other viral communicable diseases: Secondary | ICD-10-CM

## 2020-05-09 LAB — SARS COV-2 (COVID-19) RNA-~~LOC~~ LABS (BH GH LMW YH): BKR SARS-COV-2 RNA (COVID-19) (YH): NEGATIVE

## 2020-05-10 ENCOUNTER — Ambulatory Visit: Admit: 2020-05-10 | Payer: BLUE CROSS/BLUE SHIELD

## 2020-05-25 ENCOUNTER — Ambulatory Visit: Admit: 2020-05-25 | Payer: BLUE CROSS/BLUE SHIELD

## 2020-05-28 ENCOUNTER — Inpatient Hospital Stay: Admit: 2020-05-28 | Discharge: 2020-05-28 | Payer: BLUE CROSS/BLUE SHIELD

## 2020-05-28 DIAGNOSIS — Z20828 Contact with and (suspected) exposure to other viral communicable diseases: Secondary | ICD-10-CM

## 2020-05-28 LAB — SARS COV-2 (COVID-19) RNA-~~LOC~~ LABS (BH GH LMW YH): BKR SARS-COV-2 RNA (COVID-19) (YH): NEGATIVE

## 2020-05-29 ENCOUNTER — Telehealth: Admit: 2020-05-29 | Payer: PRIVATE HEALTH INSURANCE

## 2020-05-29 NOTE — Telephone Encounter
COVID-19 Occupational Health Assessment    Patient Specific Responses COVID-19 Occupational Health Intake New COVID-19 Testing Indicated? No Has the employee had a prior COVID-19 test? Yes Most Recent COVID-19 Test Result: Negative Date test done: 05/28/20 Who ordered COVID-19 testing? Other Other ordering: self sched Return to work now?   (This field must be filled out with an associated date EVERY time) Yes Final Return to Work Date (if cleared to RTW) 05/29/20

## 2020-06-08 ENCOUNTER — Inpatient Hospital Stay: Admit: 2020-06-08 | Discharge: 2020-06-08 | Payer: BLUE CROSS/BLUE SHIELD

## 2020-06-08 DIAGNOSIS — Z20822 Contact with and (suspected) exposure to covid-19: Secondary | ICD-10-CM

## 2020-06-08 DIAGNOSIS — Z20828 Contact with and (suspected) exposure to other viral communicable diseases: Secondary | ICD-10-CM

## 2020-06-08 LAB — SARS COV-2 (COVID-19) RNA-~~LOC~~ LABS (BH GH LMW YH): BKR SARS-COV-2 RNA (COVID-19) (YH): NEGATIVE

## 2020-07-14 ENCOUNTER — Encounter: Admit: 2020-07-14 | Payer: PRIVATE HEALTH INSURANCE

## 2020-07-14 DIAGNOSIS — Z1231 Encounter for screening mammogram for malignant neoplasm of breast: Secondary | ICD-10-CM

## 2020-10-01 ENCOUNTER — Inpatient Hospital Stay: Admit: 2020-10-01 | Discharge: 2020-10-01 | Payer: BLUE CROSS/BLUE SHIELD

## 2020-10-01 DIAGNOSIS — Z1231 Encounter for screening mammogram for malignant neoplasm of breast: Secondary | ICD-10-CM

## 2020-10-05 ENCOUNTER — Telehealth: Admit: 2020-10-05 | Payer: PRIVATE HEALTH INSURANCE | Attending: Internal Medicine

## 2020-10-05 NOTE — Progress Notes
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  Updated in demographics if not correct. Yes Has the employee travelled to one of the [place] states with rapid spread of COVID and/or International travel within the past 2 weeks?  No Did employee have an exposure to a known COVID-19 positive individual in the household? No Did employee have an exposure to a known COVID-19 positive individual that is NOT in the household? No Did the employee receive a COVID-19 vaccine dose in the past month?  No What side effects could potentially be due to the COVID-19 vaccine or due to COVID-19 illness are present?  Body Aches (Muscle Pain/Joint Pain), Chills, Headache, Fatigue Duration of these symptoms: > 3 days but less than 7 days What possible symptoms of COVID-19 infection are present but not likely due to a COVID-19 vaccine are present?  Cough, Nasal congestion and/or rhinitis, Sore Throat Duration of these symptoms: > 3 days but less than 7 days Date of Onset of Symptoms: 10/01/20 Has the employee had a prior COVID-19 test? Yes Most Recent COVID-19 Test Result: Positive Date test done: 10/04/20 If COVID positive, did the employee  present to work during their infectious period AND did NOT wear proper PPE AND may have exposed staff and/or patients? No Is the employee Immunocompromised? No If the employee met COVID treatment criteria, were they referred to [facility]? No Return to work now?   (This field must be filled out with an associated date EVERY time) No Out of work due to: COVID-19 Positive Expected out of work until (if NOT cleared to RTW) 10/09/20 Home Isolation Recommended? Yes Recommended self monitoring with temperature checks twice a day? Yes PCP Status: Employee has PCP   employee started with sx 10/01/20.  HA, body aches, fatigue, cough and sore throat.  Home test + 10/04/20.     Is not immunocompromised.  Quarantine and RTW guidelines explained with employee verbalizing understanding.  Covid + letter sent.  RTW date 10/09/20. Carolin Sicks, RN

## 2020-10-08 ENCOUNTER — Telehealth: Admit: 2020-10-08 | Payer: PRIVATE HEALTH INSURANCE | Attending: Occupational Medicine

## 2020-10-08 NOTE — Progress Notes
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  Updated in demographics if not correct. Yes Has the employee travelled to one of the [place] states with rapid spread of COVID and/or International travel within the past 2 weeks?  No Did employee have an exposure to a known COVID-19 positive individual in the household? Yes What exposure risk phase is [facility] in currently? Mitigation phase What is the migation exposure risk category? Household highest risk Comments: Husband has covid What side effects could potentially be due to the COVID-19 vaccine or due to COVID-19 illness are present?  Headache, Body Aches (Muscle Pain/Joint Pain), Diarrhea Duration of these symptoms: > or equal to 7 days Date of Onset of Symptoms: 10/01/20 Additional Clinical Details: States not immunocompromised. See prior call center note for full details - employee has already spoken with call center about her COVID positive home test of 10/04/2020 and called today 6/10 because she is still sx and does not feel ready to RTW tomorrow 6/11 - her OOW is extended and her new presumptive RTW date is day 11 - or 10/12/2020 Tuesday. Return to work now?   (This field must be filled out with an associated date EVERY time) No Out of work due to: COVID-19 Positive, Symptomatic Employee Additional details: Still sx. slowly improving. She did not elect to take Paxlovid. She is improving but still with some sx, including diarrhea. OOW extended with new presumptive RTW date of 10/12/2020 Expected out of work until (if NOT cleared to RTW) 10/12/20 Home Isolation Recommended? Yes Recommended self monitoring with temperature checks twice a day? Yes PCP Status: Employee has PCP   10/08/2020  1250 pm - also see prior call center note.  OOW extended until 10/12/2020.  GSR

## 2020-10-13 ENCOUNTER — Telehealth: Admit: 2020-10-13 | Payer: PRIVATE HEALTH INSURANCE | Attending: Internal Medicine

## 2020-10-13 NOTE — Progress Notes
COVID-19 Occupational Health Assessment  Flowsheet Row Patient Specific Responses COVID-19 Occupational Health Intake  Date of Onset of Symptoms: 10/01/20 New COVID-19 Testing Indicated? No Has the employee had a prior COVID-19 test? Yes Most Recent COVID-19 Test Result: Positive Date test done: 10/04/20 Return to work now?   (This field must be filled out with an associated date EVERY time) Yes Final Return to Work Date (if cleared to RTW) 10/12/20

## 2020-11-25 ENCOUNTER — Encounter: Admit: 2020-11-25 | Payer: PRIVATE HEALTH INSURANCE | Attending: Preventive Medicine/Occupational Environmental Medicine

## 2020-11-25 DIAGNOSIS — S43401A Unspecified sprain of right shoulder joint, initial encounter: Secondary | ICD-10-CM

## 2020-12-03 ENCOUNTER — Inpatient Hospital Stay: Admit: 2020-12-03 | Discharge: 2020-12-03 | Payer: Worker's Compensation

## 2020-12-03 ENCOUNTER — Encounter: Admit: 2020-12-03 | Payer: PRIVATE HEALTH INSURANCE

## 2020-12-03 DIAGNOSIS — Z5189 Encounter for other specified aftercare: Secondary | ICD-10-CM

## 2020-12-03 DIAGNOSIS — S43401A Unspecified sprain of right shoulder joint, initial encounter: Secondary | ICD-10-CM

## 2020-12-03 DIAGNOSIS — R569 Unspecified convulsions: Secondary | ICD-10-CM

## 2020-12-03 DIAGNOSIS — I2699 Other pulmonary embolism without acute cor pulmonale: Secondary | ICD-10-CM

## 2020-12-03 DIAGNOSIS — K859 Acute pancreatitis without necrosis or infection, unspecified: Secondary | ICD-10-CM

## 2020-12-03 DIAGNOSIS — Z8742 Personal history of other diseases of the female genital tract: Secondary | ICD-10-CM

## 2020-12-03 DIAGNOSIS — E7800 Pure hypercholesterolemia, unspecified: Secondary | ICD-10-CM

## 2020-12-03 DIAGNOSIS — I4729 Paroxysmal ventricular tachycardia (HC Code): Secondary | ICD-10-CM

## 2020-12-03 DIAGNOSIS — K862 Cyst of pancreas: Secondary | ICD-10-CM

## 2020-12-03 DIAGNOSIS — I10 Essential (primary) hypertension: Secondary | ICD-10-CM

## 2020-12-03 NOTE — Other
[facility] [address] [address]WUJWJ Number: [phone] Number: [phone] signature indicates that you have reviewed and agree with the established Plan of Care as documented below. This order renewal is required for therapy to continue on an outpatient basis. Please co-sign this document electronically or return via the fax number listed on the document.  Your prompt response is required and appreciated. Additional Physician Comments:   ___________________________________       __________________________________Physician Signature                                             Date___________________________________Physician Printed NameOccupational Therapy Orthopedic EvaluationPatient Name:  Natasha Swendsen SochaMedical Record Number:  [id number] Date of Birth:  [birth year]Therapist:  Yvetta Coder, OTRReferring Provider:  Nolberto Hanlon, DOICD-10 Diagnosis(es):Problem List         ICD-10-CM   OT Right shoulder sprain 12/03/20  Acute pain of right shoulder M25.511  General InformationTherapy Episode of Care  Date of Visit:   12/03/2020  Treatment Number:   1 WC   Date the Treatment Plan was Initiated/Reviewed:  12/03/2020  Start of Care Date:   12/03/2020  Onset of Illness/Injury Date:   11/25/2020  Progress Report Due Date:   9/4/2022Precautions/Limitations   Precautions/Limitations:  No known precautions/limitationsNew Neurological Deficit   Did the patient have a new neurological deficit as evidenced by diminished muscle strength   of less than 3 at any time during the 90 days after spine surgery:  [employer] Utilized?  NoCognition / Learning Assessment   Primary Learner Relationship:  Patient        Barriers to learning:  No barriers        Preferred language:  English        Preferred learning style:  Listening, Demonstration, Pictures/Video and ReadingI reviewed the Patient Care Agreement and Attendance Form with the Patient/Family.  The Patient/Family verbalized understanding.Medication Review:Current Outpatient Medications Medication Sig ? atorvastatin Take 20 mg by mouth daily.. ? buPROPion SR Take 150 mg by mouth 2 (two) times daily.  ? dextroamphetamine-amphetamine Take 30 mg by mouth every morning. ? lamoTRIgine Take 200 mg by mouth daily.  ? pantoprazole Take 1 tablet (40 mg total) by mouth every 12 (twelve) hours. ? Prenatal Vitamin Plus Low Iron Take 1 tablet by mouth daily. *OTC SubjectiveI should have went to the doctor early, but I just thought is was a sore musclePertinent History of Current Problem:  Patient is a 45 year old female who was referred to skilled OT for a right shoulder sprain. Patient is right hand dominant. 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 Patient did not go to the doctor right away as she thought it was just a sore muscle, but pain has not gone away. Patient reports difficulty completing her job with lifting heavy, outreaching her arm to the side, and reaching over her head into the cart. Patient reports difficulty with HOS, allowing her to miss sleep and not be ready for her work day. Patient would benefit from skilled OT to decrease pain and increase strength as well as establish HEP to return to previous level of pain free functioning. Past Medical HistoryPast Medical History: Diagnosis Date ? Encounter for blood transfusion   during 2nd pregnancy ?  Essential hypertension 10/12/2009 ? High cholesterol  ? History of abnormal cervical Papanicolaou smear  ? Pancreatic cyst 07/2018 ? Pancreatitis  ? Paroxysmal ventricular tachycardia (HC Code) (HC CODE) 10/12/2009  RT OUTFLOW TRACT/ ablations x 6 ? Pulmonary embolism (HC CODE) 2008  no blood thinners at this time ? Seizures (HC Code) (HC CODE)   during pregnancy Past Surgical HistoryPast Surgical History: Procedure Laterality Date ? CARDIAC ELECTROPHYSIOLOGY MAPPING AND ABLATION  04/23/2008  [place] ? CERVICAL CONE BIOPSY   ? CESAREAN SECTION   ? MIBI Exercise stress/test (NM)  10/08/2007  [employer] ? MOUTH SURGERY   ? TONSILLECTOMY   ? TUBAL LIGATION   ? 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 Lives with her husband and kidsPrior Level of Function:  Independent    Change in Status from prior level of function?   Yes ObjectivePalpation:Pain posterior shoulder above spine of scapula Pain going into the neck Pain side of shoulder - subacromial space Pain at [facility] joint Right shoulder: WNL 		Patient is reported at 90 degrees but able to go full range Left shoulder: WNLMMT:Right Shoulder Flexion 4 increased pain 	         Extension 4 increased pain 	         Abduction 4	         Adduction 4		        ER 4		         IR 4Left Shoulder 5+ throughout all motions Special Test: Speed's +Empty Can  +Hawkins Kyung Rudd + Nears +Painful Arc + Orthopedic Tools/Scales/Outcome MeasuresQuickDASH - Upper Extremity Function      Open a tight or new jar:  3      Do heavy household chores:  4      Carrying a shopping bag or briefcase:  4      Wash your back:  3      Use a knife to cut food:  1      Activities with force/impact through arm, shoulder or hand:  3      To what extent has problem interfered with normal social activities:  3      Are you limited in work or other daily activities:  3 Severity of arm, shoulder or hand pain:  4      Tingling in arm, shoulder or hand:  1      How much trouble sleeping due to pain in arm, shoulder or hand:  4   Score:  50   (The QDASH score ranges from 0-100, with higher scores reflecting increased disability)Treatment Provided This VisitCPT Code Interventions Timed Minutes Untimed Minutes Total Minutes Occupational Therapy Evaluation - Moderate Complexity (97166) Initial Evaluation completed and initiated HEP   45 45 N/A     N/A     N/A       Total Treatment Time: 45 Problem ListIncreased right shoulder pain AssessmentPatient demonstrates signs and symptoms of a painful arc and pain through RTC muscle. Patient is still working, but educated if possible to not lift heavy or perform overhead / out to the side movements with the right arm. Patient does not report numbness and tingling. Patient reports able to complete  her job but not without pain. Patient educated with proper sleeping positions to decrease pain. Patient educated to keep daily tasks to limited pain in shoulder and to watch posture. Patient educated with HEP. Patient would benefit from skilled OT to decrease pain and increase strength as well as establish HEP to return to previous level of pain free functioning to preform work tasks. Patient / Family / Caregiver EducationDiscussed role of therapyDiscussed the value of collaboration with other providersDiscussed the presenting problemReviewed the assessmentDiscussed plan of care and rationalePatient/Family/Caregiver demonstrate agreement with the planWritten materials / instruction providedRehab PotentialGoodPlanTherapeutic Exercise (97110)Manual Therapy (97140)Therapeutic Activity (97530)Self-Care/Home Management (97535)Orthotic Management Initial Encounter (97760)Neuromuscular Re-Education (97112)Electrical Stimulation - attended (97032)Heat - Ice Pack Massage (97124)Ultrasound (97035)Frequency:  2x per weekDuration:  4 weeksPlan for Next VisitModalities, manual RecommendationsHEP Sleeping positions Proper work positions [employer]- Patient will be independent with their home exercise program including stretching, strengthening exercises as well as postural, ergonomic and biomechanical principles (within 2 weeks).2- Patient will be able to sleep without interruption from their shoulder pain (within 2 weeks).3- Patient will be able to AROM shoulder flexion full ROM without a painful arc (within 2 weeks). 3- Patient will be able to lift a larger size patient without increased symptoms (within 4 weeks).5- Patient will be able to reach over her head on the top of her cart to grab something without increased symptom (within 4 weeks).6- Patient will be able to reach out to the right side to grab something from her cart without increased symptom (within 4 weeks).

## 2020-12-06 ENCOUNTER — Telehealth: Admit: 2020-12-06 | Payer: PRIVATE HEALTH INSURANCE | Attending: Family

## 2020-12-06 NOTE — Progress Notes
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  Updated in demographics if not correct. Yes Has the employee travelled to one of the [place] states with rapid spread of COVID and/or International travel within the past 2 weeks?  No Did employee have an exposure to a known COVID-19 positive individual in the household? No Did employee have an exposure to a known COVID-19 positive individual that is NOT in the household? No Did the employee receive a COVID-19 vaccine dose in the past month?  No Date of Onset of Symptoms: 12/05/20 Additional Clinical Details: Headache, sore throat, fatigue, achy New COVID-19 Testing Indicated? No Has the employee had a prior COVID-19 test? Yes Most Recent COVID-19 Test Result: Positive Date test done: 12/06/20 Who ordered COVID-19 testing? Other Other ordering: Home antigen test If COVID positive, did the employee  present to work during their infectious period AND did NOT wear proper PPE AND may have exposed staff and/or patients? No Is the employee Immunocompromised? No If the employee met COVID treatment criteria, were they referred to [facility]? No Return to work now?   (This field must be filled out with an associated date EVERY time) No Out of work due to: COVID-19 Positive, Symptomatic Employee Expected out of work until (if NOT cleared to RTW) 12/13/20 Reminded employee to file a first report of injury due to Vaccine Side Effect (if employee was out of work > 72 hrs. for side effects) N/A Home Isolation Recommended? Yes Recommended self monitoring with temperature checks twice a day? Yes PCP Status: Employee has PCP Employee referred to: No referrals  Employee not immunosuppressedEmployee instructed no need to retest or  to contact Occupational Health for RTW determination. May return to work on date provided as long as no fever, diarrhea or vomiting 24 hrs prior and is feeling well enough to do so. Employee verbalizes understanding.

## 2020-12-07 ENCOUNTER — Ambulatory Visit: Admit: 2020-12-07 | Payer: Worker's Compensation

## 2020-12-07 DIAGNOSIS — S43401A Unspecified sprain of right shoulder joint, initial encounter: Secondary | ICD-10-CM

## 2020-12-09 ENCOUNTER — Ambulatory Visit: Admit: 2020-12-09 | Payer: Worker's Compensation

## 2020-12-09 DIAGNOSIS — M25511 Pain in right shoulder: Secondary | ICD-10-CM

## 2020-12-09 DIAGNOSIS — S43401A Unspecified sprain of right shoulder joint, initial encounter: Secondary | ICD-10-CM

## 2020-12-13 ENCOUNTER — Telehealth: Admit: 2020-12-13 | Payer: PRIVATE HEALTH INSURANCE | Attending: Internal Medicine

## 2020-12-13 NOTE — Progress Notes
COVID-19 Occupational Health Assessment  Flowsheet Row Patient Specific Responses COVID-19 Occupational Health Intake  Return to work now?   (This field must be filled out with an associated date EVERY time) No Out of work due to: COVID-19 Positive, Symptomatic Employee Expected out of work until (if NOT cleared to RTW) 12/16/20 Additional Comments: COVID+ employee, still not feeling well, extend out 72 hours, advised to reach out to PCP and employee says she will call

## 2020-12-14 ENCOUNTER — Encounter: Admit: 2020-12-14 | Payer: PRIVATE HEALTH INSURANCE | Attending: Gastroenterology

## 2020-12-14 ENCOUNTER — Ambulatory Visit: Admit: 2020-12-14 | Payer: Worker's Compensation

## 2020-12-14 DIAGNOSIS — M25511 Pain in right shoulder: Secondary | ICD-10-CM

## 2020-12-14 DIAGNOSIS — K862 Cyst of pancreas: Secondary | ICD-10-CM

## 2020-12-15 ENCOUNTER — Telehealth: Admit: 2020-12-15 | Payer: PRIVATE HEALTH INSURANCE | Attending: Family

## 2020-12-15 ENCOUNTER — Encounter: Admit: 2020-12-15 | Payer: PRIVATE HEALTH INSURANCE | Attending: Gastroenterology

## 2020-12-15 DIAGNOSIS — K862 Cyst of pancreas: Secondary | ICD-10-CM

## 2020-12-15 NOTE — Progress Notes
COVID-19 Occupational Health Assessment  Flowsheet Row Patient Specific Responses COVID-19 Occupational Health Intake  What side effects could potentially be due to the COVID-19 vaccine or due to COVID-19 illness are present?  Body Aches (Muscle Pain/Joint Pain) What possible symptoms of COVID-19 infection are present but not likely due to a COVID-19 vaccine are present?  Sore Throat Return to work now?   (This field must be filled out with an associated date EVERY time) No Out of work due to: COVID-19 Positive, Symptomatic Employee Expected out of work until (if NOT cleared to RTW) 12/17/20 Additional Comments: Employee followed up with clinic yesterday and was tested for Strep and Flu, both were negative. She is not feeling well to RTW yet, will extend another 24 hours. Anticipated RTW for 12/17/20.

## 2020-12-16 ENCOUNTER — Inpatient Hospital Stay: Admit: 2020-12-16 | Discharge: 2020-12-16 | Payer: Worker's Compensation

## 2020-12-16 DIAGNOSIS — M25511 Pain in right shoulder: Secondary | ICD-10-CM

## 2020-12-16 NOTE — Other
[facility] [address] [address]OZDGU Number: [phone] Number: [phone] Therapy Cancel Note8/18/2022Patient Name:  Natasha Marchiano FultonMedical Record Number:  [id number] Date of Birth:  June 28, 1977Therapist:  Yvetta Coder, Oris Drone cancelled today's appointment. Patient reports still testing positive for COVID.

## 2020-12-18 ENCOUNTER — Telehealth: Admit: 2020-12-18 | Payer: PRIVATE HEALTH INSURANCE | Attending: Internal Medicine

## 2020-12-18 NOTE — Progress Notes
COVID-19 Occupational Health Assessment  Flowsheet Row Patient Specific Responses COVID-19 Occupational Health Intake  Additional Clinical Details: isolation period completed, presumptive clearance sent. Return to work now?   (This field must be filled out with an associated date EVERY time) Yes Final Return to Work Date (if cleared to RTW) 12/18/20

## 2020-12-21 ENCOUNTER — Inpatient Hospital Stay: Admit: 2020-12-21 | Discharge: 2020-12-21 | Payer: Worker's Compensation

## 2020-12-21 DIAGNOSIS — M25511 Pain in right shoulder: Secondary | ICD-10-CM

## 2020-12-21 DIAGNOSIS — S43401A Unspecified sprain of right shoulder joint, initial encounter: Secondary | ICD-10-CM

## 2020-12-21 NOTE — Other
[facility] [address] [address]WGNFA Number: [phone] Number: [phone] Therapy Daily NotePatient Name:  Natasha Igoe FultonMedical Record Number:  [id number] Date of Birth:  11-16-1977Therapist:  Yvetta Coder, OTRReferring Provider:  Nolberto Hanlon, DOICD-10 Diagnosis(es):Problem List         ICD-10-CM   OT Right shoulder sprain 12/03/20  Acute pain of right shoulder M25.511  General InformationTherapy Episode of Care  Date of Visit:   12/21/2020  Treatment Number:   2 WC   Date the Treatment Plan was Initiated/Reviewed:  12/03/2020  Start of Care Date:   12/03/2020  Onset of Illness/Injury Date:   11/25/2020  Progress Report Due Date:   9/4/2022Precautions/Limitations   Precautions/Limitations:  No known precautions/limitationsNew Neurological Deficit   Did the patient have a new neurological deficit as evidenced by diminished muscle strength   of less than 3 at any time during the 90 days after spine surgery:  [employer] Utilized?  NoCognition / Learning Assessment   Primary Learner Relationship:  Patient        Barriers to learning:  No barriers        Preferred language:  English        Preferred learning style:  Listening, Demonstration, Pictures/Video and ReadingI reviewed the Patient Care Agreement and Attendance Form with the Patient/Family.  The Patient/Family verbalized understanding.Medication Review:Current Outpatient Medications Medication Sig ? atorvastatin Take 20 mg by mouth daily.. ? buPROPion SR Take 150 mg by mouth 2 (two) times daily.  ? dextroamphetamine-amphetamine Take 30 mg by mouth every morning. ? lamoTRIgine Take 200 mg by mouth daily.  ? pantoprazole Take 1 tablet (40 mg total) by mouth every 12 (twelve) hours. ? Prenatal Vitamin Plus Low Iron Take 1 tablet by mouth daily. *OTC (Patient not taking: Reported on 12/03/2020) SubjectiveMy shoulder was feeling good while I had COVID but back to work this week, it is aching again. ObjectiveTreatment Provided This VisitCPT Code Interventions Timed Minutes Untimed Minutes Total Minutes Manual Therapy (97140) Ultrasound frequency?1.0?mhz, Duty cycle 100%. Intensity 0.8 to supraspinatus insertion with shoulder in IR   Sleeper stretch on wall TFM of supraspinatus insertionSoft tissue mobilization of left shoulder muscles with biofreezeSoft tissue massage to upper trap Review HEP  28  28 N/A     N/A     N/A       Total Treatment Time: 28 AssessmentPatient tolerated TFM with good affect. Patient reports pain in upper trap. Patient educated to continue HEP and to keep daily work tasks under annoying level of pain. PlanFrequency:  2x per weekPlan for Next VisitModalities, ROM

## 2020-12-23 ENCOUNTER — Inpatient Hospital Stay: Admit: 2020-12-23 | Discharge: 2020-12-23 | Payer: Worker's Compensation

## 2020-12-23 DIAGNOSIS — S43401A Unspecified sprain of right shoulder joint, initial encounter: Secondary | ICD-10-CM

## 2020-12-23 DIAGNOSIS — M25511 Pain in right shoulder: Secondary | ICD-10-CM

## 2020-12-23 NOTE — Other
[facility] [address] [address]WUJWJ Number: [phone] Number: [phone] Therapy Daily NotePatient Name:  Natasha Radford FultonMedical Record Number:  [id number] Date of Birth:  [birth year]Therapist:  Yvetta Coder, OTRReferring Provider:  Nolberto Hanlon, DOICD-10 Diagnosis(es):Problem List         ICD-10-CM   OT Right shoulder sprain 12/03/20  Acute pain of right shoulder M25.511  General InformationTherapy Episode of Care  Date of Visit:   12/23/2020  Treatment Number:   3 WC   Date the Treatment Plan was Initiated/Reviewed:  12/03/2020  Start of Care Date:   12/03/2020  Onset of Illness/Injury Date:   11/25/2020  Progress Report Due Date:   9/4/2022Precautions/Limitations   Precautions/Limitations:  No known precautions/limitationsNew Neurological Deficit   Did the patient have a new neurological deficit as evidenced by diminished muscle strength   of less than 3 at any time during the 90 days after spine surgery:  [employer] Utilized?  NoCognition / Learning Assessment   Primary Learner Relationship:  Patient        Barriers to learning:  No barriers        Preferred language:  English        Preferred learning style:  Listening, Demonstration, Pictures/Video and ReadingI reviewed the Patient Care Agreement and Attendance Form with the Patient/Family.  The Patient/Family verbalized understanding.Medication Review:Current Outpatient Medications Medication Sig ? atorvastatin Take 20 mg by mouth daily.. ? buPROPion SR Take 150 mg by mouth 2 (two) times daily.  ? dextroamphetamine-amphetamine Take 30 mg by mouth every morning. ? lamoTRIgine Take 200 mg by mouth daily.  ? pantoprazole Take 1 tablet (40 mg total) by mouth every 12 (twelve) hours. ? Prenatal Vitamin Plus Low Iron Take 1 tablet by mouth daily. *OTC (Patient not taking: Reported on 12/03/2020) SubjectiveIt is feeling okay today. ObjectiveTreatment Provided This VisitCPT Code Interventions Timed Minutes Untimed Minutes Total Minutes Manual Therapy (97140) Heat prior to session  Soft tissue massage with trigger point release to upper trapSupine oscillations grade 2 with posterior glides Scapula mobilization PROM shoulder flexion, abductionSupine passive posterior capsule stretch Spike ball roll out to upper trap and boarder of scapula on wall  Review HEP  28  28 N/A     N/A     N/A       Total Treatment Time: 28 AssessmentPatient reports pain posterior shoulder and slightly down lateral side of shoulder. Patient demonstrates multiple trigger points in upper trap/boarder of scapula. Patient educated to continue HEP and to keep daily work tasks under annoying level of pain. PlanFrequency:  2x per weekPlan for Next VisitModalities, ROM, bands

## 2020-12-28 ENCOUNTER — Inpatient Hospital Stay: Admit: 2020-12-28 | Discharge: 2020-12-28 | Payer: Worker's Compensation

## 2020-12-28 NOTE — Other
[facility] [address] [place] [address]W[place]JWJ Number: [phone] Number: [phone] Therapy Daily NotePatient Name:  Natasha Deskins FultonMedical Record Number:  [id number] Date of Birth:  [birth year]Therapist:  Yvetta Coder, OTRReferring Provider:  Nolberto Hanlon, DOICD-10 Diagnosis(es):Problem List         ICD-10-CM   OT Right shoulder sprain 12/03/20  Acute pain of right shoulder M25.511  General InformationTherapy Episode of Care  Date of Visit:   12/28/2020  Treatment Number:   4 / 13 WC   Date the Treatment Plan was Initiated/Reviewed:  12/03/2020  Start of Care Date:   12/03/2020  Onset of Illness/Injury Date:   11/25/2020  Progress Report Due Date:   9/4/2022Precautions/Limitations   Precautions/Limitations:  No known precautions/limitationsNew Neurological Deficit   Did the patient have a new neurological deficit as evidenced by diminished muscle strength   of less than 3 at any time during the 90 days after spine surgery:  [employer] Utilized?  NoCognition / Learning Assessment   Primary Learner Relationship:  Patient        Barriers to learning:  No barriers        Preferred language:  English        Preferred learning style:  Listening, Demonstration, Pictures/Video and ReadingI reviewed the Patient Care Agreement and Attendance Form with the Patient/Family.  The Patient/Family verbalized understanding.Medication Review:Current Outpatient Medications Medication Sig ? atorvastatin Take 20 mg by mouth daily.. ? buPROPion SR Take 150 mg by mouth 2 (two) times daily.  ? dextroamphetamine-amphetamine Take 30 mg by mouth every morning. ? lamoTRIgine Take 200 mg by mouth daily.  ? pantoprazole Take 1 tablet (40 mg total) by mouth every 12 (twelve) hours. ? Prenatal Vitamin Plus Low Iron Take 1 tablet by mouth daily. *OTC (Patient not taking: Reported on 12/03/2020) SubjectiveI was sore after last session. ObjectiveTreatment Provided This VisitCPT Code Interventions Timed Minutes Untimed Minutes Total Minutes Therapeutic Exercise (97110) Heat prior to session  Soft tissue massage with trigger point release to upper trapScapular mobilization Yellow T band rows, extension, ER/IR, shoulder flexion to 90, serratus punches Spike ball periscapular circles on wall Review HEP  28  28 N/A     N/A     N/A       Total Treatment Time: 28 AssessmentPatient reports seeing MD on 01/06/21. Patient reports some pain with yellow T band shoulder flexion and serratus punches. Patient educated to continue HEP and to keep daily work tasks under annoying level of pain. PlanFrequency:  2x per weekPlan for Next VisitModalities, ROM, bands

## 2020-12-29 DIAGNOSIS — S43401A Unspecified sprain of right shoulder joint, initial encounter: Secondary | ICD-10-CM

## 2020-12-29 DIAGNOSIS — M25511 Pain in right shoulder: Secondary | ICD-10-CM

## 2020-12-30 ENCOUNTER — Inpatient Hospital Stay: Admit: 2020-12-30 | Discharge: 2020-12-30 | Payer: BLUE CROSS/BLUE SHIELD

## 2020-12-30 NOTE — Other
[facility] [address] [address] Number: [phone] Number: [phone] signature indicates that you have reviewed and agree with the established Plan of Care as documented below. This order renewal is required for therapy to continue on an outpatient basis. Please co-sign this document electronically or return via the fax number listed on the document.  Your prompt response is required and appreciated. Additional Physician Comments:   ___________________________________       __________________________________Physician Signature                                             Date___________________________________Physician Printed NameOccupational Therapy Orthopedic Re-EvaluationPatient Name:  Natasha Charnley FultonMedical Record Number:  [id number] Date of Birth:  Oct 25, 1977Therapist:  Yvetta Coder, OTRReferring Provider:  Nolberto Hanlon, DOICD-10 Diagnosis(es):Problem List         ICD-10-CM   OT Right shoulder sprain 12/03/20  Acute pain of right shoulder M25.511  General InformationTherapy Episode of Care  Date of Visit:   12/30/2020  Treatment Number:   5 / 13 WC   Date the Treatment Plan was Initiated/Reviewed:  12/03/2020  Start of Care Date:   12/03/2020  Onset of Illness/Injury Date:   11/25/2020  Progress Report Due Date:   10/1/2022Precautions/Limitations   Precautions/Limitations:  No known precautions/limitationsNew Neurological Deficit   Did the patient have a new neurological deficit as evidenced by diminished muscle strength   of less than 3 at any time during the 90 days after spine surgery:  NoInterpreter [employer] Utilized?  NoCognition / Learning Assessment   Primary Learner Relationship:  Patient        Barriers to learning:  No barriers        Preferred language:  English Preferred learning style:  Listening, Demonstration, Pictures/Video and ReadingI reviewed the Patient Care Agreement and Attendance Form with the Patient/Family.  The Patient/Family verbalized understanding.Medication Review:Current Outpatient Medications Medication Sig ? atorvastatin Take 20 mg by mouth daily.. ? buPROPion SR Take 150 mg by mouth 2 (two) times daily.  ? dextroamphetamine-amphetamine Take 30 mg by mouth every morning. ? lamoTRIgine Take 200 mg by mouth daily.  ? pantoprazole Take 1 tablet (40 mg total) by mouth every 12 (twelve) hours. ? Prenatal Vitamin Plus Low Iron Take 1 tablet by mouth daily. *OTC (Patient not taking: Reported on 12/03/2020) SubjectiveRe-evaluation:Initial evaluation:I should have went to the doctor early, but I just thought is was a sore musclePertinent History of Current Problem:  Patient is a 45 year old female who was referred to skilled OT for a right shoulder sprain. Patient is right hand dominant. 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 Patient did not go to the doctor right away as she thought it was just a sore muscle, but pain has not gone away. Patient reports difficulty completing her job with lifting heavy, outreaching her arm to the side, and reaching over her head into the cart. Patient reports difficulty with HOS, allowing her to miss sleep and not be ready for her work day. Patient would benefit from skilled OT to decrease pain and increase strength as well as establish HEP to return to previous level of pain free functioning. Past Medical HistoryPast Medical History: Diagnosis Date ? Encounter for blood transfusion   during 2nd  pregnancy ? Essential hypertension 10/12/2009 ? High cholesterol  ? History of abnormal cervical Papanicolaou smear  ? Pancreatic cyst 07/2018 ? Pancreatitis  ? Paroxysmal ventricular tachycardia (HC Code) (HC CODE) (HC Code) 10/12/2009  RT OUTFLOW TRACT/ ablations x 6 ? Pulmonary embolism (HC Code) 2008  no blood thinners at this time ? Seizures (HC Code) (HC CODE) (HC Code)   during pregnancy Past Surgical HistoryPast Surgical History: Procedure Laterality Date ? CARDIAC ELECTROPHYSIOLOGY MAPPING AND ABLATION  04/23/2008  [place] ? CERVICAL CONE BIOPSY   ? CESAREAN SECTION   ? MIBI Exercise stress/test (NM)  10/08/2007  [facility] ? MOUTH SURGERY   ? TONSILLECTOMY   ? TUBAL LIGATION   ? 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 Lives with her husband and kidsPrior Level of Function:  Independent    Change in Status from prior level of function?   Yes ObjectivePalpation:Pain posterior shoulder above spine of scapula Pain going into the neck Pain side of shoulder - subacromial space Pain at [facility] joint Right shoulder: AROM Flexion        111			Abduction      105				ER       90				IR          35Left shoulder: WNLMMT:Right Shoulder Flexion 4 increased pain 	         Extension 4 increased pain 	         Abduction 4	         Adduction 4		        ER 4		         IR 4Left Shoulder 5+ throughout all motions Special Test: Speed's +Empty Can  +Hawkins Kyung Rudd + Nears +Painful Arc + Orthopedic Tools/Scales/Outcome MeasuresQuickDASH - Upper Extremity Function      Open a tight or new jar:  3      Do heavy household chores:  4      Carrying a shopping bag or briefcase:  4      Wash your back:  3      Use a knife to cut food:  1      Activities with force/impact through arm, shoulder or hand:  3      To what extent has problem interfered with normal social activities:  3      Are you limited in work or other daily activities:  3      Severity of arm, shoulder or hand pain:  4      Tingling in arm, shoulder or hand:  1      How much trouble sleeping due to pain in arm, shoulder or hand:  4   Score:  50   (The QDASH score ranges from 0-100, with higher scores reflecting increased disability)Treatment Provided This VisitCPT Code Interventions Timed Minutes Untimed Minutes Total Minutes Therapeutic Exercise (97110) Heat prior to session  Informal assessment AROM measurements taken Strength and provocative testing completedSoft tissue massage with trigger point release to upper trapScapular mobilization Yellow T band rows, extension, ER/IRIR strap stretchPosterior capsule stretch  Review HEP  25  25 N/A     N/A     N/A       Total Treatment  Time: 25 Problem ListIncreased right shoulder pain AssessmentPatient continues to reports a constant ache in shoulder (neck, shoulder, shoulder blade, and down arm). Patient reports pain all the down to inferior angle of scapula. Patient reports pain into the neck and numbness and tingling/pain to thumb and index finger. Patient tested positive for cervical screen. Recommend further testing for neck. Patient tolerated bands with limited motion and slight increase pull to superior/posterior shoulder. Patient educated with HEP. Patient would benefit from continued skilled OT to decrease pain and increase ROM and strength as well as progress HEP to return to previous level of pain free functioning to perform work tasks. Patient / Family / Caregiver EducationDiscussed role of therapyDiscussed the value of collaboration with other providersDiscussed the presenting problemReviewed the assessmentDiscussed plan of care and rationalePatient/Family/Caregiver demonstrate agreement with the planWritten materials / instruction providedRehab PotentialGoodPlanTherapeutic Exercise (97110)Manual Therapy (97140)Therapeutic Activity (97530)Self-Care/Home Management (97535)Orthotic Management Initial Encounter (97760)Neuromuscular Re-Education (97112)Electrical Stimulation - attended (97032)Heat - Ice Pack Massage (97124)Ultrasound (97035)Frequency:  2x per weekDuration:  4 weeksPlan for Next VisitModalities, manual RecommendationsHEP Sleeping positions Proper work positions [employer]- Patient will be independent with their home exercise program including stretching, strengthening exercises as well as postural, ergonomic and biomechanical principles (within 2 weeks). MET2- Patient will be able to sleep without interruption from their shoulder pain (within 2 weeks). PROGRESSING3- Patient will be able to AROM shoulder flexion full ROM without a painful arc (within 2 weeks). PROGRESSING3- Patient will be able to lift a larger size patient without increased symptoms (within 4 weeks). PROGRESSING5- Patient will be able to reach over her head on the top of her cart to grab something without increased symptom (within 4 weeks). PROGRESSING6- Patient will be able to reach out to the right side to grab something from her cart without increased symptom (within 4 weeks). PROGRESSING

## 2021-01-04 ENCOUNTER — Ambulatory Visit: Admit: 2021-01-04 | Payer: BLUE CROSS/BLUE SHIELD

## 2021-01-04 NOTE — Other
[facility] [address] [address]WUJWJ Number: [phone] Number: [phone] Therapy Cancel Note9/6/2022Patient Name:  Natasha Liberto FultonMedical Record Number:  [id number] Date of Birth:  08-Nov-1977Therapist:  Yvetta Coder, OTRHeather Arnetha Massy cancelled today's appointment due to a conflict.

## 2021-01-06 ENCOUNTER — Encounter: Admit: 2021-01-06 | Payer: PRIVATE HEALTH INSURANCE | Attending: Family Medicine

## 2021-01-06 ENCOUNTER — Inpatient Hospital Stay: Admit: 2021-01-06 | Discharge: 2021-01-06 | Payer: BLUE CROSS/BLUE SHIELD

## 2021-01-06 ENCOUNTER — Inpatient Hospital Stay: Admit: 2021-01-06 | Discharge: 2021-01-06 | Payer: Worker's Compensation

## 2021-01-06 DIAGNOSIS — S139XXD Sprain of joints and ligaments of unspecified parts of neck, subsequent encounter: Secondary | ICD-10-CM

## 2021-01-06 NOTE — Other
[facility] [address] [place] [address] Number: [phone] Number: [phone] Therapy Daily NotePatient Name:  Natasha Polus FultonMedical Record Number:  [id number] Date of Birth:  1977-09-29Therapist:  Yvetta Coder, OTRReferring Provider:  Nolberto Hanlon, DOICD-10 Diagnosis(es):Problem List         ICD-10-CM   OT Right shoulder sprain 12/03/20  Acute pain of right shoulder M25.511  General InformationTherapy Episode of Care  Date of Visit:   01/06/2021  Treatment Number:   6 / 13 WC   Date the Treatment Plan was Initiated/Reviewed:  12/03/2020  Start of Care Date:   12/03/2020  Onset of Illness/Injury Date:   11/25/2020  Progress Report Due Date:   10/1/2022Precautions/Limitations   Precautions/Limitations:  No known precautions/limitationsNew Neurological Deficit   Did the patient have a new neurological deficit as evidenced by diminished muscle strength   of less than 3 at any time during the 90 days after spine surgery:  NoInterpreter [employer] Utilized?  NoCognition / Learning Assessment   Primary Learner Relationship:  Patient        Barriers to learning:  No barriers        Preferred language:  English        Preferred learning style:  Listening, Demonstration, Pictures/Video and ReadingI reviewed the Patient Care Agreement and Attendance Form with the Patient/Family.  The Patient/Family verbalized understanding.Medication Review:Current Outpatient Medications Medication Sig ? atorvastatin Take 20 mg by mouth daily.. ? buPROPion SR Take 150 mg by mouth 2 (two) times daily.  ? dextroamphetamine-amphetamine Take 30 mg by mouth every morning. ? lamoTRIgine Take 200 mg by mouth daily.  ? pantoprazole Take 1 tablet (40 mg total) by mouth every 12 (twelve) hours. ? Prenatal Vitamin Plus Low Iron Take 1 tablet by mouth daily. *OTC (Patient not taking: Reported on 12/03/2020) SubjectiveThursday night after work, I almost went to the ED my pain was so bad after I turned my head while in the car. ObjectiveTreatment Provided This VisitCPT Code Interventions Timed Minutes Untimed Minutes Total Minutes Manual Therapy (97140) Heat prior to session Informal assessmentSupine oscillationsSoft tissue massage Posterior glides Review POC  23  23 N/A     N/A     N/A       Total Treatment Time: 23 AssessmentPatient reports having follow up MD apt today after therapy. Patient reports increased numbness to bilateral hands recently and dropping objects more frequently. Patient reports pain with oscillations, posterior glides, and massage running right to the neck and down the boarder of the scapulas. Patient reports pain without relief to upper trap. Previous visits, patient unable to tolerate periscapular strengthening as well. Patient recommended to receive evaluation and further testing of neck. Patient tested positive for cervical neck screen. PlanFrequency:  2x per weekPlan for Next VisitModalities, ROM

## 2021-01-11 ENCOUNTER — Inpatient Hospital Stay: Admit: 2021-01-11 | Discharge: 2021-01-11 | Payer: BLUE CROSS/BLUE SHIELD

## 2021-01-11 DIAGNOSIS — M25511 Pain in right shoulder: Secondary | ICD-10-CM

## 2021-01-11 NOTE — Other
[facility] [address] [address]G[place] Number: [phone] Number: [phone] signature indicates that you have reviewed and agree with the established Plan of Care as documented below. This order renewal is required for therapy to continue on an outpatient basis. Please co-sign this document electronically or return via the fax number listed on the document.  Your prompt response is required and appreciated. Additional Physician Comments:   ___________________________________       __________________________________Physician Signature                                             Date___________________________________Physician Printed NameOccupational Therapy Orthopedic Discharge NotePatient Name:  Natasha Tsan FultonMedical Record Number:  [id number] Date of Birth:  07-03-1977Therapist:  Yvetta Coder, OTRReferring Provider:  Nolberto Hanlon, DOICD-10 Diagnosis(es):Problem List         ICD-10-CM   OT Right shoulder sprain 12/03/20  Acute pain of right shoulder M25.511  General InformationTherapy Episode of Care  Date of Visit:   12/03/2020  Treatment Number:   7 / 13 WC   Date the Treatment Plan was Initiated/Reviewed:  12/03/2020  Start of Care Date:   12/03/2020  Onset of Illness/Injury Date:   11/25/2020  Progress Report Due Date:   9/4/2022Precautions/Limitations   Precautions/Limitations:  No known precautions/limitationsNew Neurological Deficit   Did the patient have a new neurological deficit as evidenced by diminished muscle strength   of less than 3 at any time during the 90 days after spine surgery:  [employer] Utilized?  NoCognition / Learning Assessment   Primary Learner Relationship:  Patient        Barriers to learning:  No barriers        Preferred language:  English Preferred learning style:  Listening, Demonstration, Pictures/Video and ReadingI reviewed the Patient Care Agreement and Attendance Form with the Patient/Family.  The Patient/Family verbalized understanding.Medication Review:Current Outpatient Medications Medication Sig ? atorvastatin Take 20 mg by mouth daily.. ? buPROPion SR Take 150 mg by mouth 2 (two) times daily.  ? dextroamphetamine-amphetamine Take 30 mg by mouth every morning. ? lamoTRIgine Take 200 mg by mouth daily.  ? pantoprazole Take 1 tablet (40 mg total) by mouth every 12 (twelve) hours. ? Prenatal Vitamin Plus Low Iron Take 1 tablet by mouth daily. *OTC (Patient not taking: Reported on 12/03/2020) SubjectiveDischarge:I am sore today and MD said to continue PT for my neck. Initial evaluation:I should have went to the doctor early, but I just thought is was a sore musclePertinent History of Current Problem:  Patient is a 45 year old female who was referred to skilled OT for a right shoulder sprain. Patient is right hand dominant. 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 Patient did not go to the doctor right away as she thought it was just a sore muscle, but pain has not gone away. Patient reports difficulty completing her job with lifting heavy, outreaching her arm to the side, and reaching over her head into the cart. Patient reports difficulty with HOS, allowing her to miss sleep and not be ready for her work day. Patient would benefit from skilled OT to decrease pain and increase strength as well as establish HEP to return to previous level of pain free functioning. Past Medical  HistoryPast Medical History: Diagnosis Date ? Encounter for blood transfusion   during 2nd pregnancy ? Essential hypertension 10/12/2009 ? High cholesterol  ? History of abnormal cervical Papanicolaou smear  ? Pancreatic cyst 07/2018 ? Pancreatitis  ? Paroxysmal ventricular tachycardia (HC Code) (HC CODE) (HC Code) 10/12/2009  RT OUTFLOW TRACT/ ablations x 6 ? Pulmonary embolism (HC Code) 2008  no blood thinners at this time ? Seizures (HC Code) (HC CODE) (HC Code)   during pregnancy Past Surgical HistoryPast Surgical History: Procedure Laterality Date ? CARDIAC ELECTROPHYSIOLOGY MAPPING AND ABLATION  04/23/2008  [place] ? CERVICAL CONE BIOPSY   ? CESAREAN SECTION   ? MIBI Exercise stress/test (NM)  10/08/2007  [facility] ? MOUTH SURGERY   ? TONSILLECTOMY   ? TUBAL LIGATION   ? 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 Lives with her husband and kidsPrior Level of Function:  Independent    Change in Status from prior level of function?   Yes ObjectivePalpation:Pain posterior shoulder above spine of scapula Pain going into the neck Pain side of shoulder - subacromial space Pain at [facility] joint ?Right shoulder: AROM Flexion        111                                    Abduction      105                                                ER       90                                                IR          35 Left shoulder: WNLMMT:Right Shoulder Flexion 4 increased pain 	         Extension 4 increased pain 	         Abduction 4	         Adduction 4		        ER 4		         IR 4Left Shoulder 5+ throughout all motions Special Test: Speed's +Empty Can  +Hawkins Kyung Rudd + Nears +Painful Arc + Orthopedic Tools/Scales/Outcome MeasuresQuickDASH - Upper Extremity Function      Open a tight or new jar:  3      Do heavy household chores:  4      Carrying a shopping bag or briefcase:  4      Wash your back:  3      Use a knife to cut food:  1      Activities with force/impact through arm, shoulder or hand:  3      To what extent has problem interfered with normal social activities:  3      Are you limited in  work or other daily activities:  3      Severity of arm, shoulder or hand pain:  4      Tingling in arm, shoulder or hand:  1      How much trouble sleeping due to pain in arm, shoulder or hand:  4   Score:  50   (The QDASH score ranges from 0-100, with higher scores reflecting increased disability)Treatment Provided This VisitCPT Code Interventions Timed Minutes Untimed Minutes Total Minutes Therapeutic Exercise (97110) Heat prior to session ?Informal assessment AROM measurements taken Strength and provocative testing completedSoft tissue massage with trigger point release to upper trapScapular mobilization Yellow T band rows, extension, ER/IRIR strap stretchPosterior capsule stretch ?Review HEP  25  25 N/A     N/A     N/A       Total Treatment Time: 25 AssessmentPatient report getting X-rays completed and showing narrowing of C5-C6 narrowing with lots of arthritis. Patient continues to report increased pain in shoulders. Patient educated to keep daily tasks to limited pain in shoulder and to watch posture. Patient educated to continue HEP within pain tolerance as well as to reach out to MD or central scheduling if not heard from them to make a PT appointment.  Recommend D/C skilled OT due to increased pain in location of neck and would benefit therapy from PT. D/C OT. Patient / Family / Caregiver EducationDiscussed role of therapyDiscussed the value of collaboration with other providersDiscussed the presenting problemReviewed the assessmentDiscussed plan of care and rationalePatient/Family/Caregiver demonstrate agreement with the planWritten materials / instruction providedRecommendationsHEP Sleeping positions Proper work positions [employer]- Patient will be independent with their home exercise program including stretching, strengthening exercises as well as postural, ergonomic and biomechanical principles (within 2 weeks). MET2- Patient will be able to sleep without interruption from their shoulder pain (within 2 weeks). NOT MET3- Patient will be able to AROM shoulder flexion full ROM without a painful arc (within 2 weeks). NOT MET3- Patient will be able to lift a larger size patient without increased symptoms (within 4 weeks). NOT MET5- Patient will be able to reach over her head on the top of her cart to grab something without increased symptom (within 4 weeks). NOT MET6- Patient will be able to reach out to the right side to grab something from her cart without increased symptom (within 4 weeks). NOT MET

## 2021-01-20 ENCOUNTER — Ambulatory Visit: Admit: 2021-01-20 | Payer: BLUE CROSS/BLUE SHIELD

## 2021-01-20 DIAGNOSIS — M25511 Pain in right shoulder: Secondary | ICD-10-CM

## 2021-01-28 DIAGNOSIS — M25511 Pain in right shoulder: Secondary | ICD-10-CM

## 2021-10-13 IMAGING — MR MRI ABDOMEN W/WO CONTRAST WITH MRCP
16 of 24 series · 29 of 48 positions shown · IV contrast (gadolinium)
Comparison: None.

________________________________________________________________________________________________ 
MRI ABDOMEN W/WO CONTRAST WITH MRCP, 10/13/2021 [DATE]: 
CLINICAL INDICATION: Upper abdominal pain. History of chronic pancreatitis.
TECHNIQUE: Multiplanar, multi acquisition MR images of the abdomen were 
performed without and with intravenous gadolinium enhancement including dynamic 
imaging.  MRCP sequences were performed with post processing.  7 mL of Gadavist 
were injected intravenously. .5 mL of Gadavist was discarded.  
Patient was scanned on a 1.5T magnet. .

[Series 101: survey-head 1st · axial · 15.0mm · 1.76mm/px · z∈[-50,+224]mm · 2 of 15 slices shown]
[im 1/15]
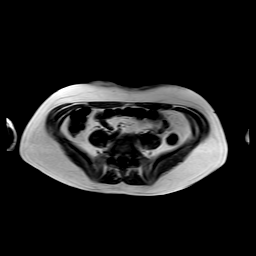
[im 15/15]
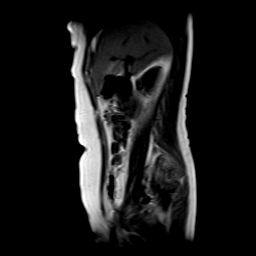

[Series 201: T2 · coronal · 5.0mm · 0.88mm/px · 2 of 34 slices shown]
[im 1/34]
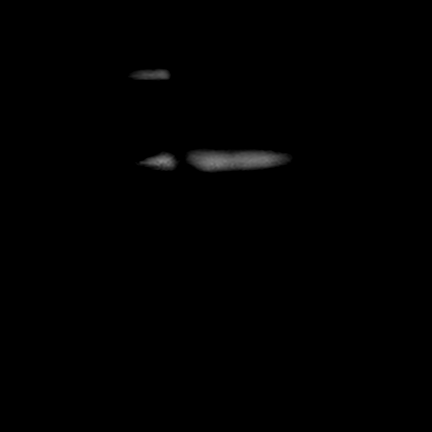
[im 34/34]
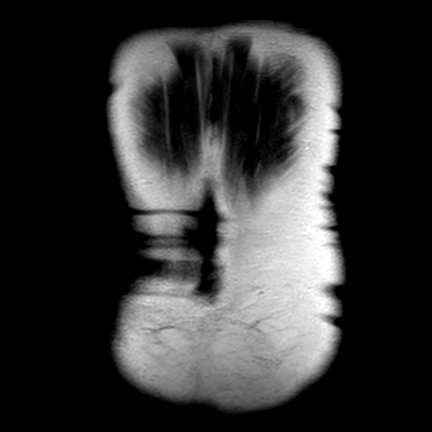

[Series 302: sout of phase · axial · 6.0mm · 1.10mm/px · z∈[-22,+223]mm · 2 of 36 slices shown]
[im 1/36]
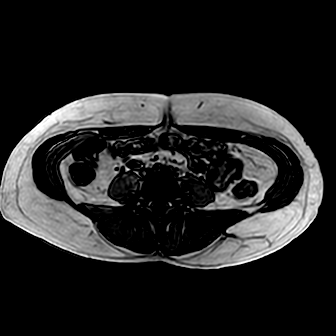
[im 36/36]
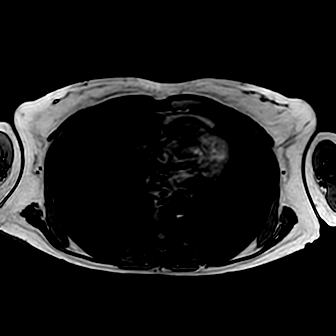

[Series 303: sin phase · axial · 6.0mm · 1.10mm/px · 1 of 36 slices shown]
[im 1/36]
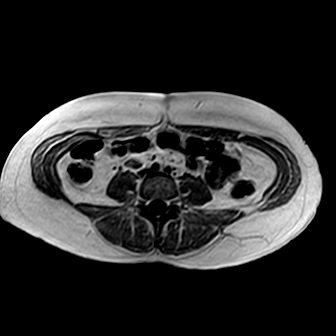

[Series 401: t2_ax_mvxd_hr_rt · axial · 5.0mm · 0.72mm/px · 1 of 42 slices shown]
[im 1/42]
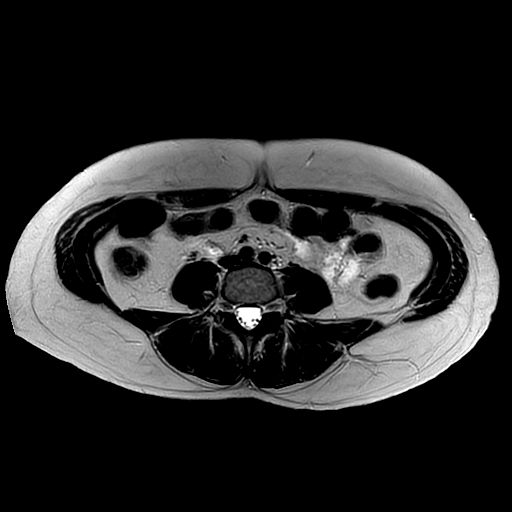

[Series 501: t2_spair mvxd_rt_fast · axial · 5.0mm · 0.83mm/px · 1 of 42 slices shown]
[im 1/42]
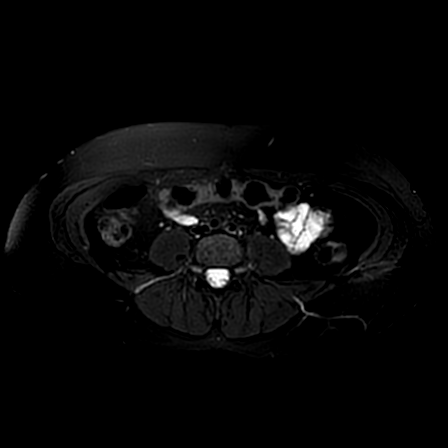

[Series 602: sbo · axial · 5.0mm · 1.61mm/px · 1 of 44 slices shown]
[im 1/44]
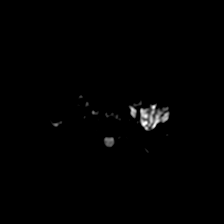

[Series 603: (id) · axial · 5.0mm · 1.61mm/px · 1 of 44 slices shown]
[im 1/44]
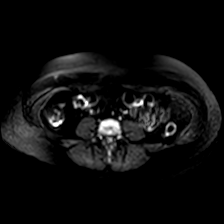

[Series 604: dadc 600 · axial · 5.0mm · 1.61mm/px · 1 of 44 slices shown]
[im 1/44]
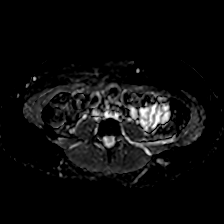

[Series 701: 3d_grase_bh · coronal · 3.0mm · 0.78mm/px · 2 of 58 slices shown]
[im 1/58]
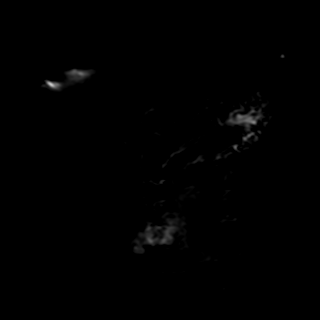
[im 58/58]
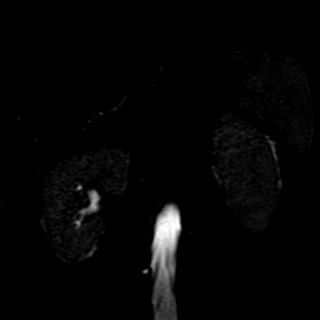

[Series 801: ssh_mrcp_radial_bh · coronal · 40.0mm · 0.59mm/px · 1 of 5 slices shown]
[im 1/5]
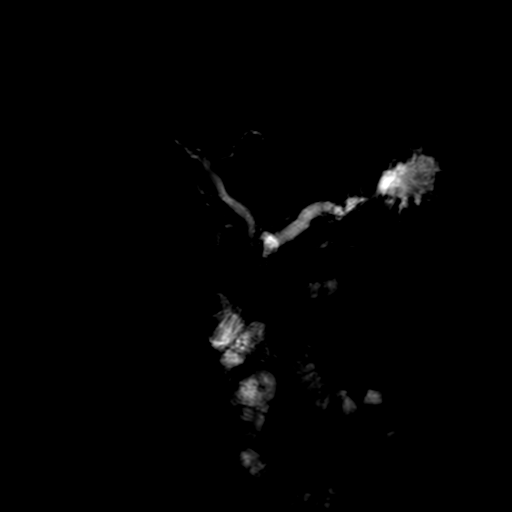

[Series 902: DIXON · axial · 4.0mm · 0.79mm/px · z∈[-4,+234]mm · 3 of 120 slices shown (1 of 5)]
[im 1/120]
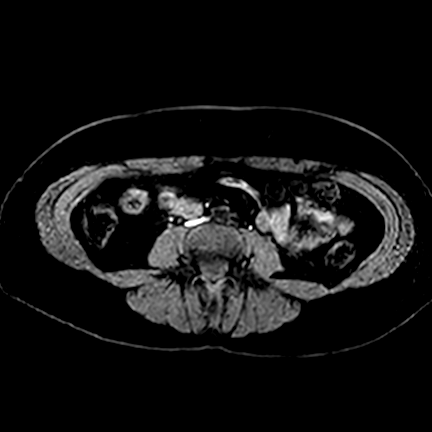
[im 60/120]
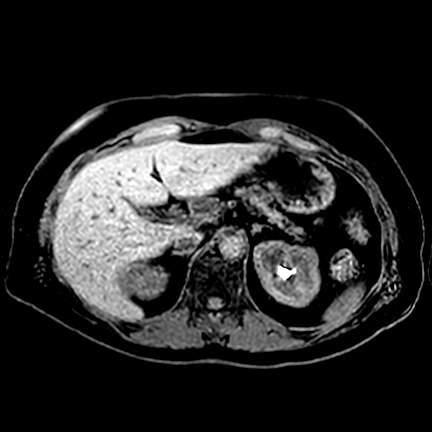
[im 120/120]
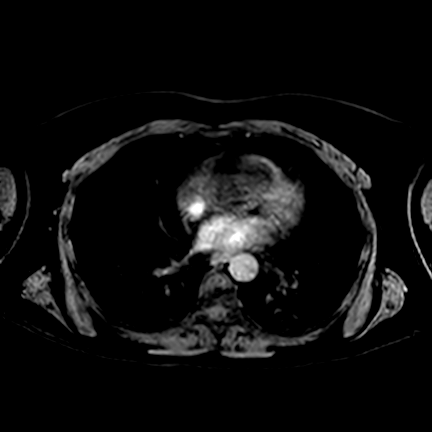

[Series 903: DIXON · axial · 4.0mm · 0.79mm/px · z∈[-4,+234]mm · 3 of 120 slices shown (2 of 5)]
[im 1/120]
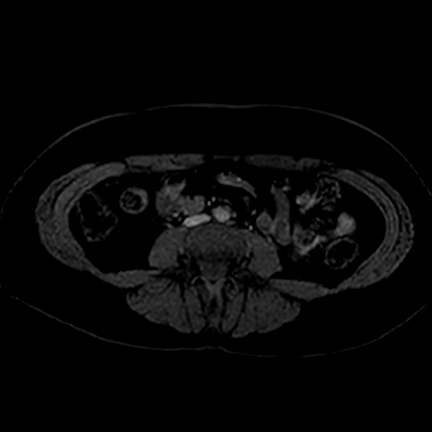
[im 60/120]
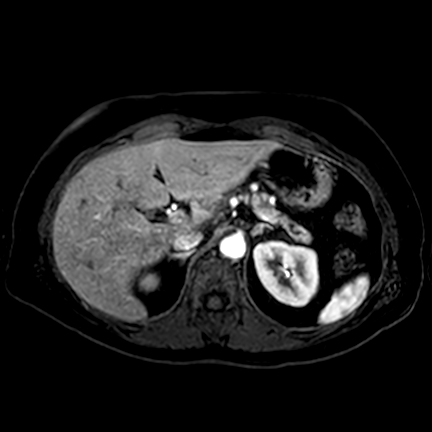
[im 120/120]
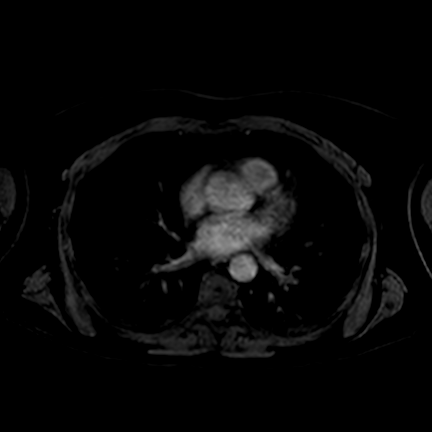

[Series 904: DIXON · axial · 4.0mm · 0.79mm/px · z∈[-4,+234]mm · 3 of 120 slices shown (3 of 5)]
[im 1/120]
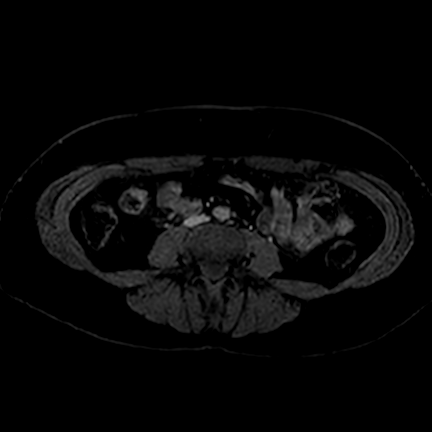
[im 60/120]
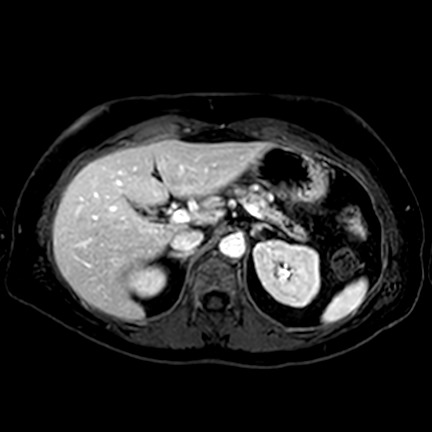
[im 120/120]
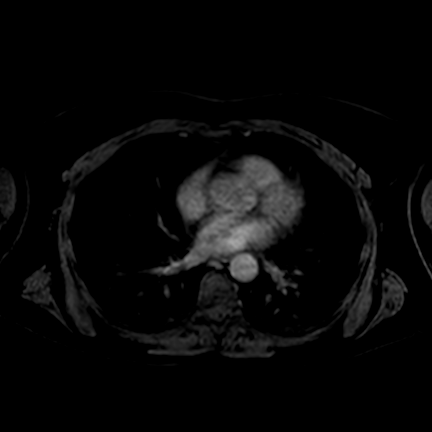

[Series 905: DIXON · axial · 4.0mm · 0.79mm/px · z∈[-4,+234]mm · 3 of 120 slices shown (4 of 5)]
[im 1/120]
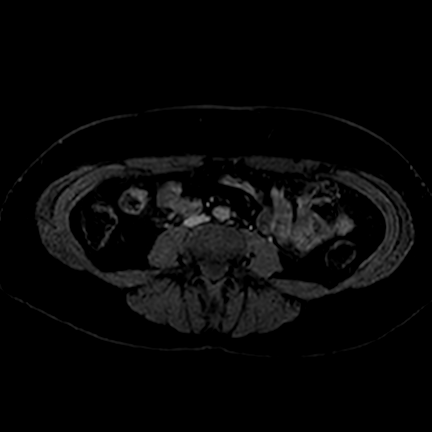
[im 60/120]
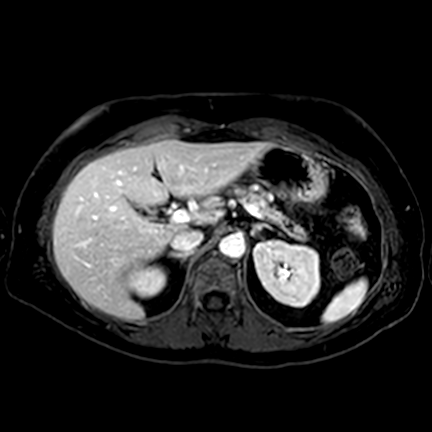
[im 120/120]
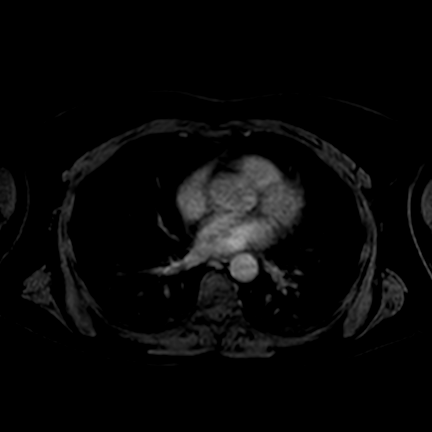

[Series 906: DIXON · axial · 4.0mm · 0.79mm/px · z∈[-4,+114]mm · 2 of 120 slices shown (5 of 5)]
[im 1/120]
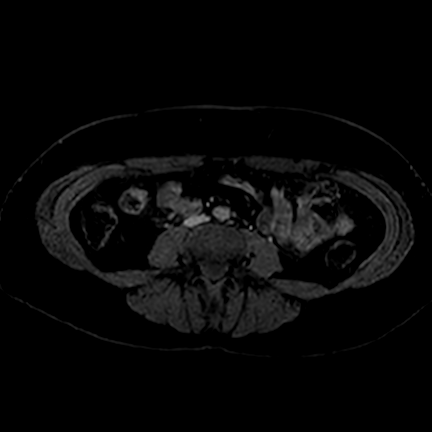
[im 60/120]
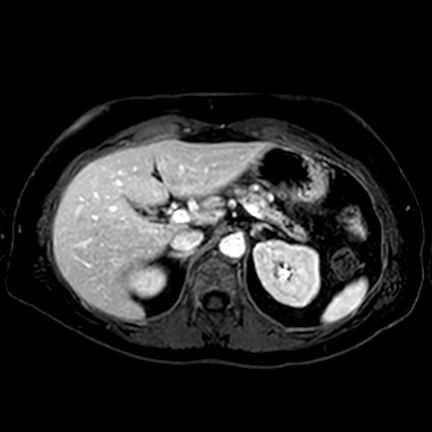

[29 of 48 positions shown; findings below may reference images not displayed]

FINDINGS: There is pronounced diffuse irregular pancreatic ductal dilatation with maximum 
ductal diameter in the neck of the gland measuring up to 8.5 mm. The side branch 
ductules are diffusely prominent throughout the pancreas. No visible filling 
defects are found within the pancreatic duct to suggest intraductal calculi. No 
discrete cystic lesions or solid lesions are in the pancreas. No abnormal focal 
restricted diffusion is detected in the gland. 
There is a small duodenal diverticulum. On the fat-suppressed T2 axial sequences 
there is diffuse edema appearing to be centered upon the pancreatic head and 
adjacent duodenum. Exact etiology is unknown. Possible causes include focal 
acute pancreatitis of the pancreatic head, peptic ulcer disease of the duodenum 
and duodenal diverticulitis. No other potentially acute pathology is detected. 
The gallbladder and biliary tract appear normal. No evidence for 
choledocholithiasis lithiasis. The liver is normal in overall size and shape. No 
focal hepatic lesions are found and there is no evidence for excessive fat or 
iron deposition. The spleen appears normal. The RIGHT adrenal is normal. There 
is a potential 2 mm cyst in the LEFT adrenal which is otherwise normal. No 
significant renal abnormalities are found. Abdominal aorta is normal in caliber. 
There are no enlarged lymph nodes or ascites. Degenerative changes are focally 
prominent in the L5-S1 disc. Incidental note is made of multiple small breast 
cysts.
IMPRESSION: 1. There is diffuse edema in the general region of the pancreatic head and 
duodenum. A duodenal diverticulum is present. Potential etiologic possibilities 
for this acute appearing process include focal acute pancreatitis of the 
pancreatic head, peptic ulcer disease of the duodenum and duodenal 
diverticulitis. No other acute pathology is found. 
2. There is diffuse irregular dilatation of the main pancreatic duct and diffuse 
prominence of the pancreatic side branches consistent with chronic pancreatitis. 
No evidence for intraductal calculi are found. 
3. Gallbladder and biliary tract are unremarkable. The liver is normal.

## 2021-11-09 IMAGING — MG MAMMOGRAPHY SCREENING BILATERAL 3[PERSON_NAME]
8 series · 9 of 24 positions shown · non-contrast
Comparison: Comparison was made to prior examinations.

________________________________________________________________________________________________ 
MAMMOGRAPHY SCREENING BILATERAL 3[facility], 11/09/2021 [DATE]: 
CLINICAL INDICATION: Encounter for screening mammogram.
TECHNIQUE: Digital bilateral mammograms and 3-D Tomosynthesis were obtained. 
These were interpreted both primarily and with the aid of computer-aided 
detection system.  
BREAST DENSITY: (Level B) There are scattered areas of fibroglandular density.

[L CC]
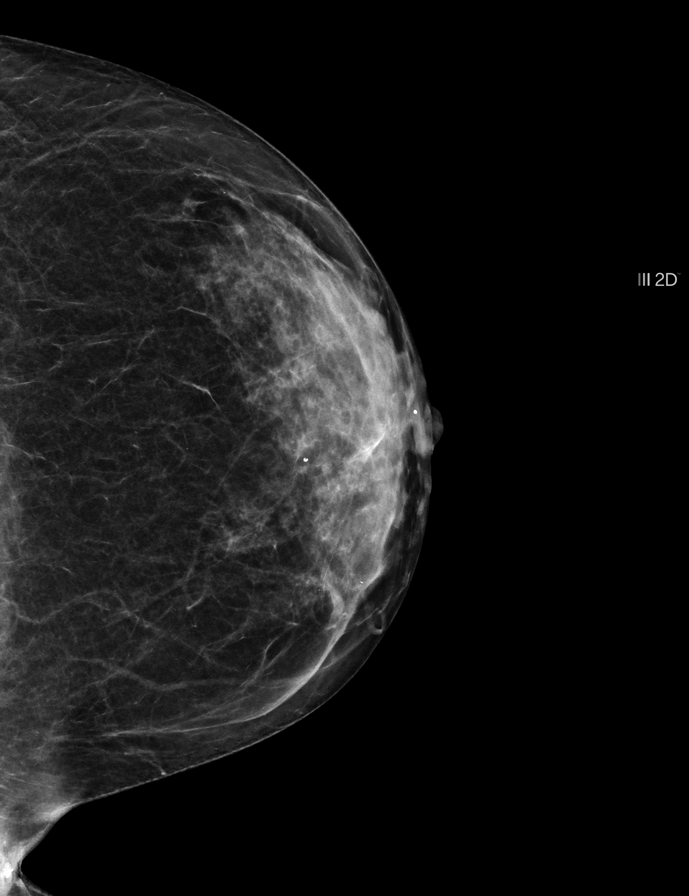

[L MLO]
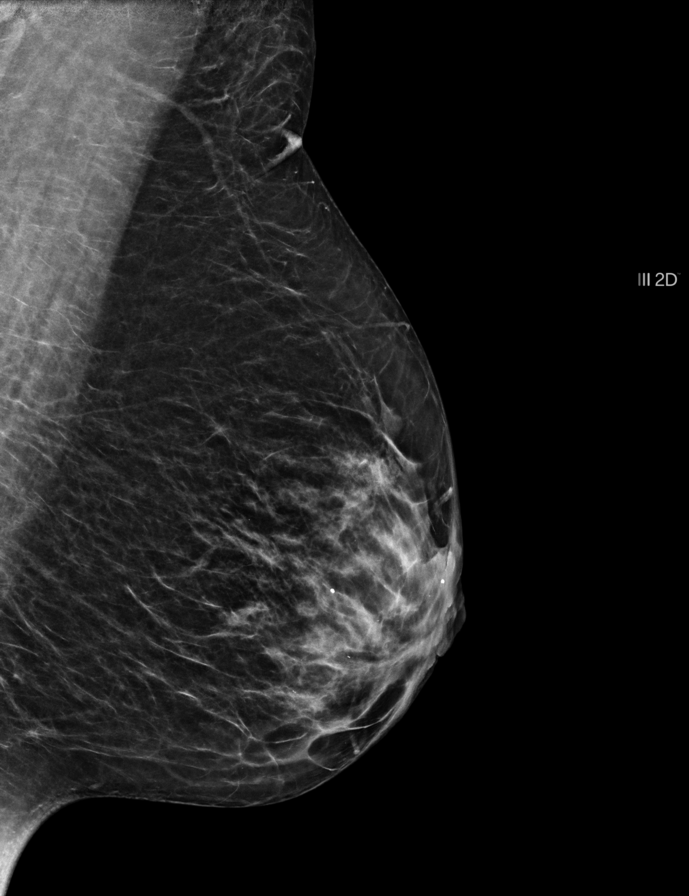

[R CC]
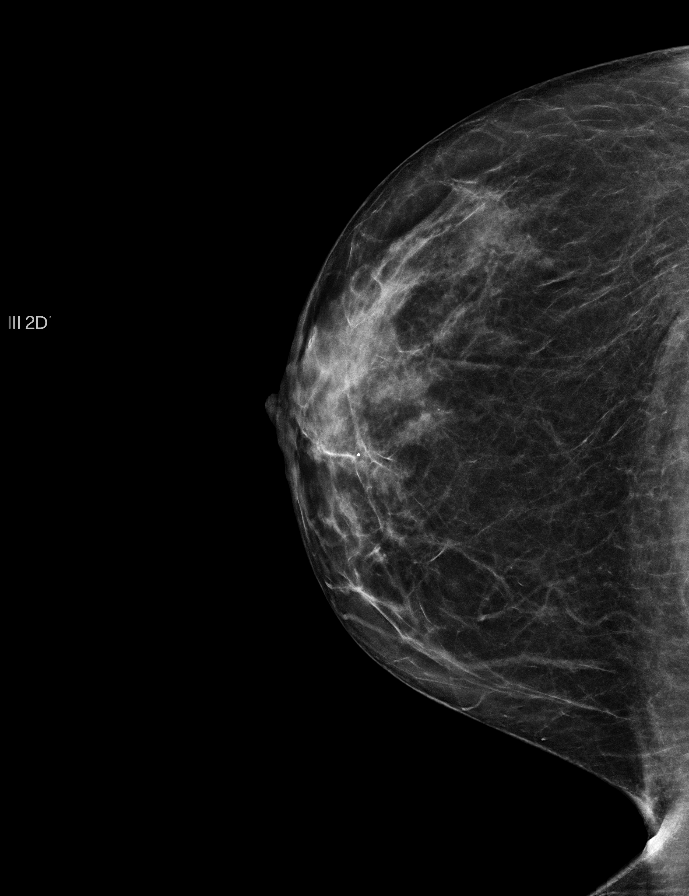

[R MLO]
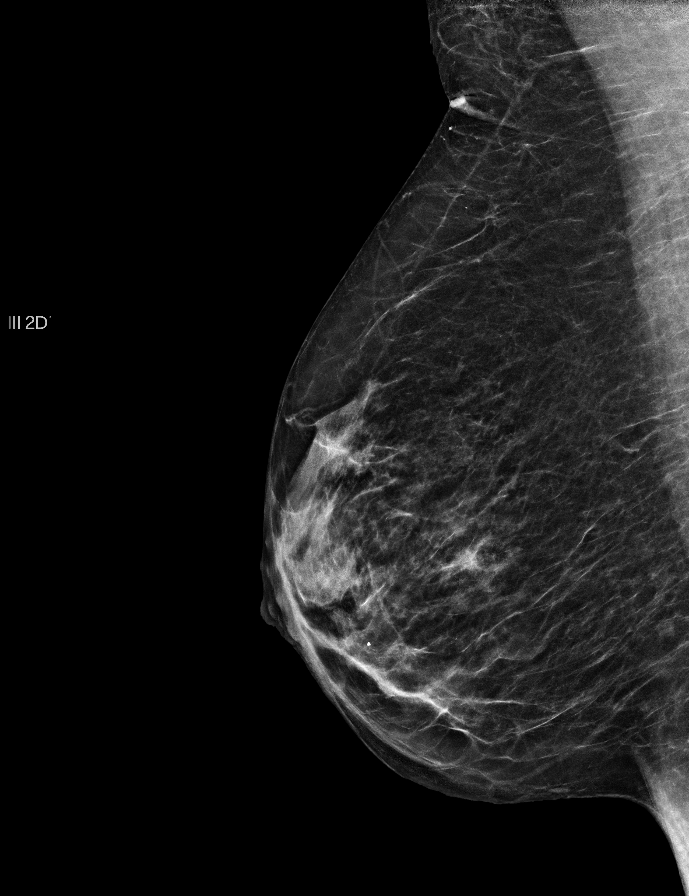

[R CC tomo · 2 of 51 frames shown]
[frame 17/51]
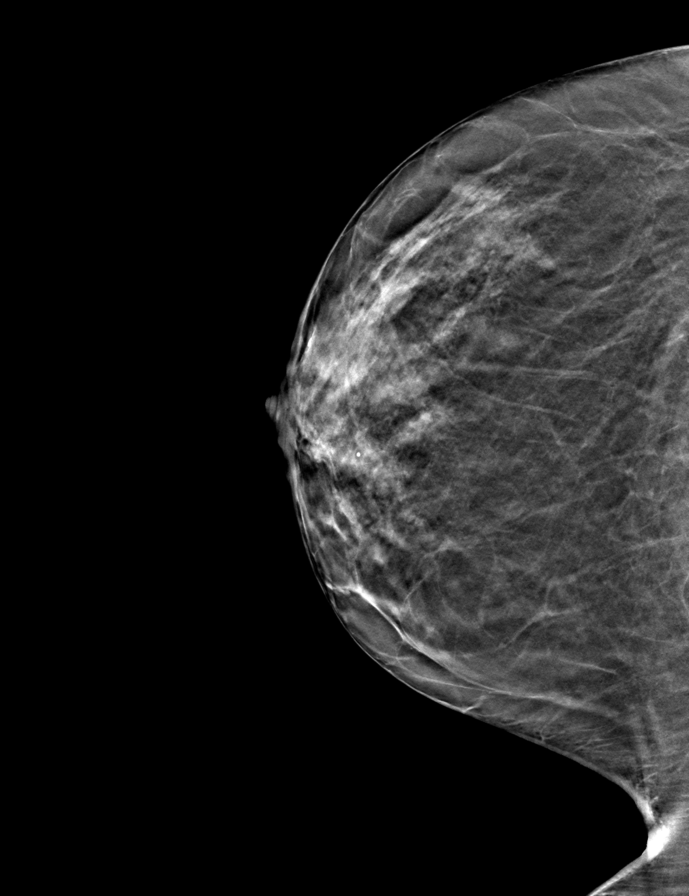
[frame 26/51]
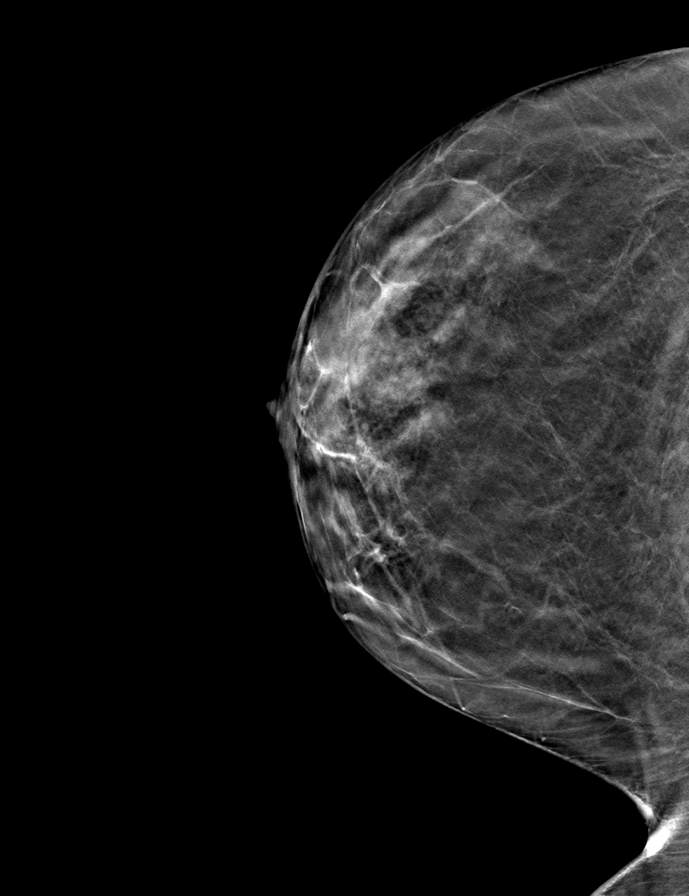

[L MLO tomo · tomo slice 27/54.0]
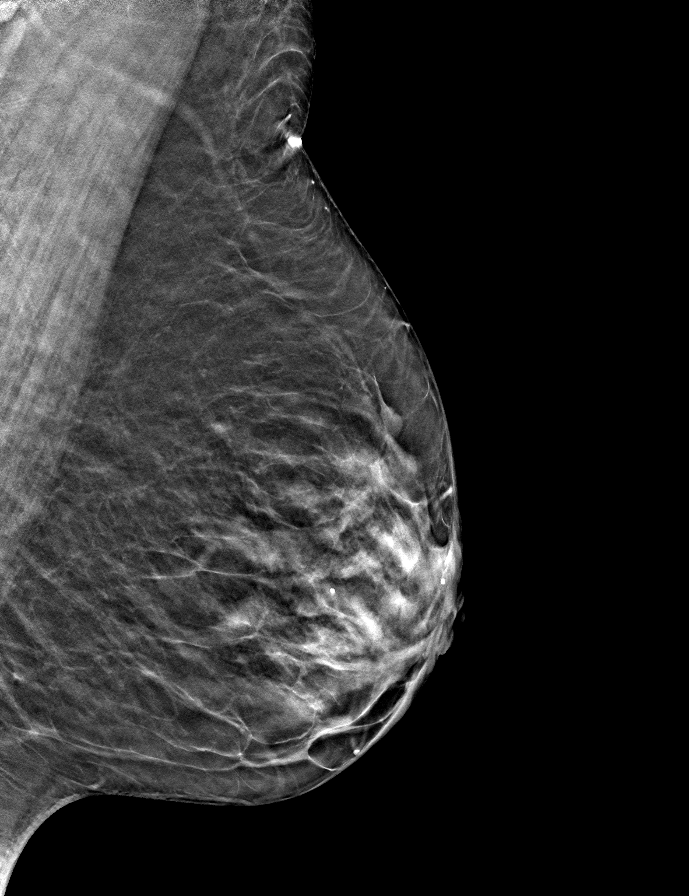

[L CC tomo · tomo slice 25/48.0]
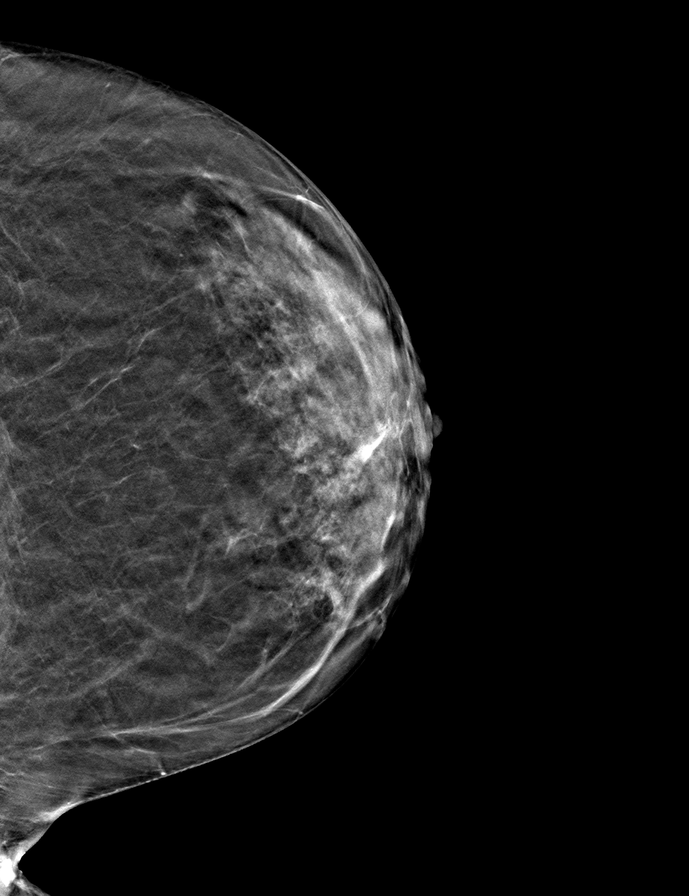

[R MLO tomo · tomo slice 25/50.0]
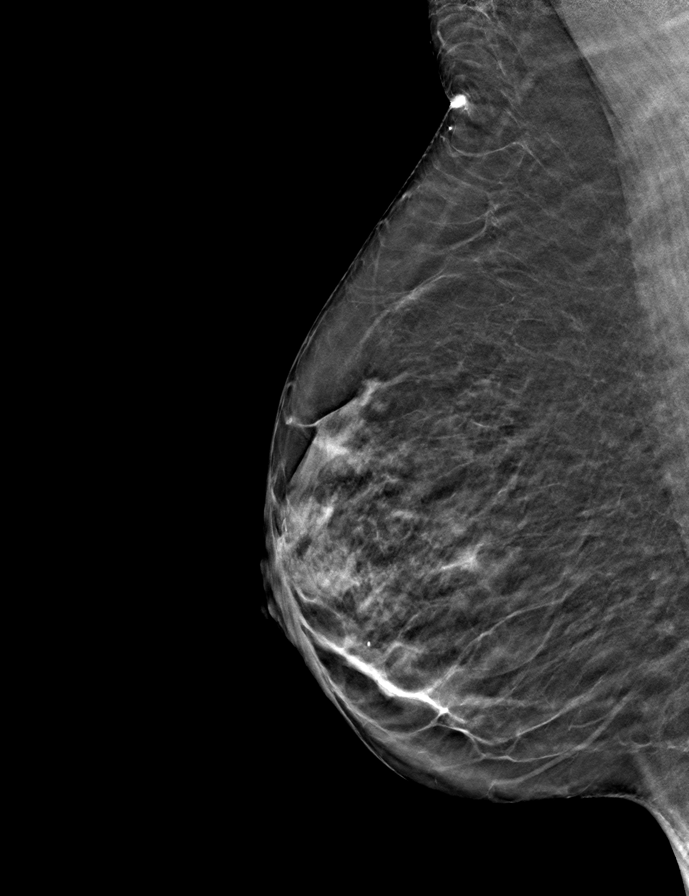

[9 of 24 positions shown; findings below may reference images not displayed]

FINDINGS: Benign calcification. No suspicious mass, calcifications, or area of 
architectural distortion in either breast.
IMPRESSION: Stable mammogram. 
(BI-RADS 2) Benign findings. Routine mammographic follow-up is recommended.

## 2022-02-27 IMAGING — NM HEPATOBILIARY SCAN WITH SINCALIDE
3 series · 22 of 22 positions shown · non-contrast
Comparison: none

________________________________________________________________________________________________ 
HEPATOBILIARY SCAN WITH SINCALIDE, 02/27/2022 [DATE]: 
CLINICAL INDICATION: Upper Abdominal Pain. Nausea and Vomiting..
TECHNIQUE: The patient was injected with 8.2 mCi of LAVIGNE technetium 99m Choletec. 
Imaging of the right upper quadrant were then performed. 1.0 mcg of Kinevac was 
injected intravenously and a stimulated gallbladder ejection fraction was 
performed.

[Series 1000: hepatobiliary ef · 3.90mm/px · 6 of 30 frames shown]
[frame 3/30]
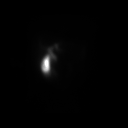
[frame 8/30]
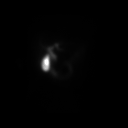
[frame 13/30]
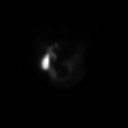
[frame 18/30]
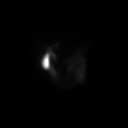
[frame 23/30]
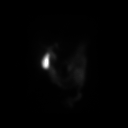
[frame 28/30]
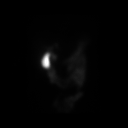

[Series 1000: hida 0-(id) · 3.90mm/px · 6 of 60 frames shown (1 of 2)]
[frame 6/60]
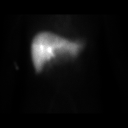
[frame 16/60]
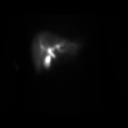
[frame 26/60]
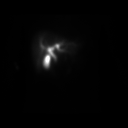
[frame 36/60]
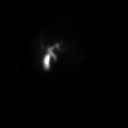
[frame 46/60]
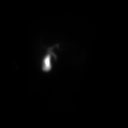
[frame 56/60]
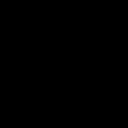

[Series 1000: hida 0-(id) · 1.95mm/px · 10 of 10 slices shown (2 of 2)]
[im 1/10]
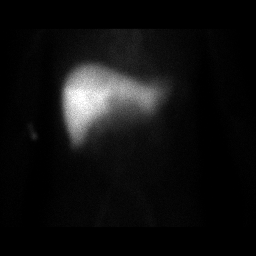
[im 2/10]
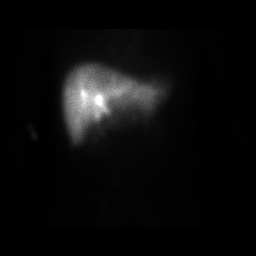
[im 3/10]
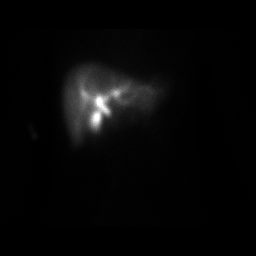
[im 4/10]
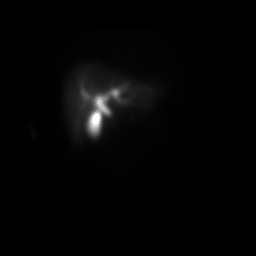
[im 5/10]
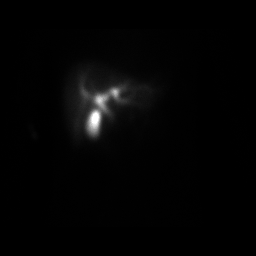
[im 6/10]
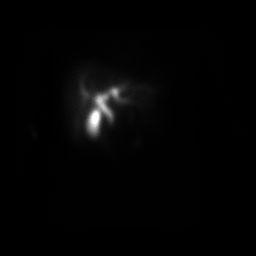
[im 7/10]
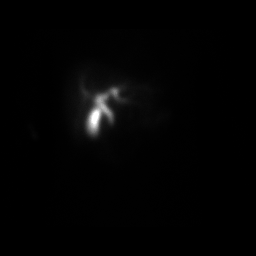
[im 8/10]
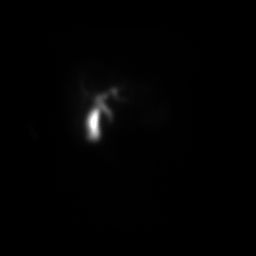
[im 9/10]
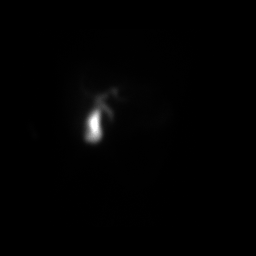
[im 10/10]
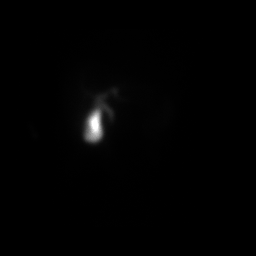

[22 of 22 positions shown; findings below may reference images not displayed]

FINDINGS: There is rapid uptake and excretion by the liver. The gallbladder and 
small bowel are visualized promptly. The ejection fraction is calculated at 26%. 
This is below the lower limits of normal at 35%.
IMPRESSION: No evidence of acute cholecystitis. The depressed ejection fraction can be seen 
with chronic cholecystitis/biliary dyskinesia.

## 2022-03-06 IMAGING — MR MRI ABDOMEN W/WO CONTRAST WITH MRCP
19 of 23 series · 35 of 48 positions shown · IV contrast (gadolinium)
Comparison: MRI abdomen October 13, 2021.

________________________________________________________________________________________________ 
MRI ABDOMEN W/WO CONTRAST WITH MRCP, 03/06/2022 [DATE]: 
CLINICAL INDICATION: Chronic pancreatitis, unspecified pancreatitis type. 
Dilated pancreatic duct. Dilated common bile duct. Upper abdomen pain. Nausea 
and vomiting.
TECHNIQUE: Multiplanar, multi acquisition MR images of the abdomen were 
performed without and with intravenous gadolinium enhancement including dynamic 
imaging.  MRCP sequences were performed with post processing.  6 mL of Gadavist 
were injected intravenously. 1.5 mL of Gadavist was discarded.  
Patient was scanned on a 1.5T magnet.

[Series 101: survey-head 1st · axial · 15.0mm · 1.76mm/px · 1 of 15 slices shown]
[im 1/15]
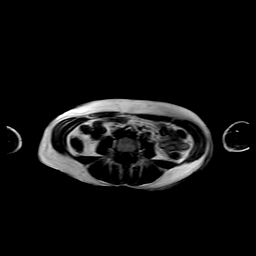

[Series 201: T2 · coronal · 5.0mm · 0.74mm/px · 1 of 32 slices shown]
[im 1/32]
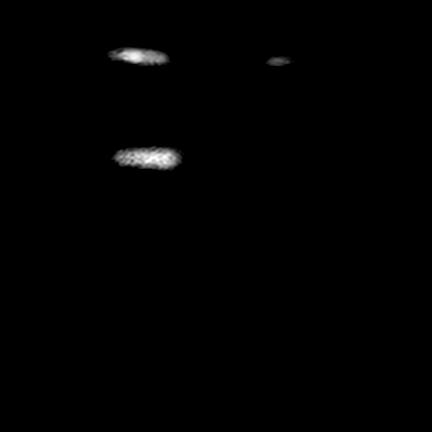

[Series 302: sout of phase · axial · 6.0mm · 1.03mm/px · 1 of 36 slices shown]
[im 1/36]
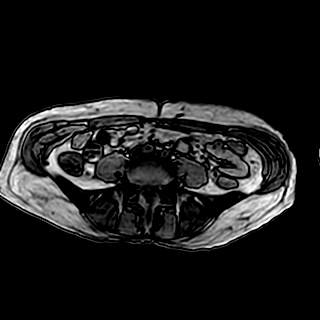

[Series 303: sin phase · axial · 6.0mm · 1.03mm/px · 1 of 36 slices shown]
[im 1/36]
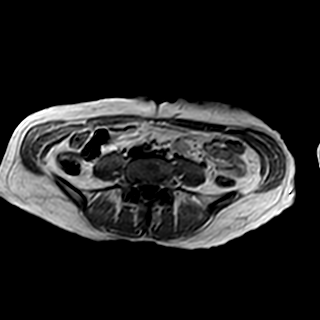

[Series 401: t2_ax_mvxd_hr_rt · axial · 5.0mm · 0.76mm/px · 1 of 42 slices shown]
[im 1/42]
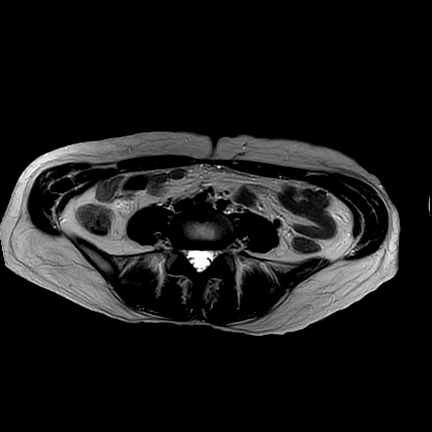

[Series 501: t2_spair mvxd_rt_fast · axial · 5.0mm · 0.82mm/px · 1 of 42 slices shown]
[im 1/42]
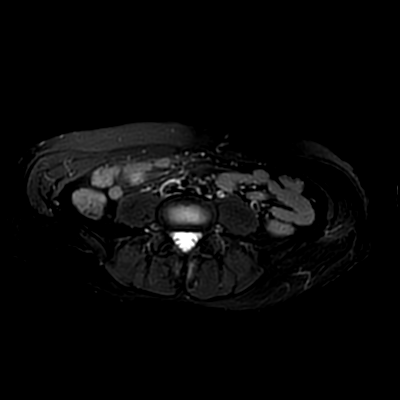

[Series 602: sbo · axial · 5.0mm · 1.47mm/px · 1 of 44 slices shown]
[im 1/44]
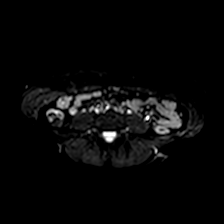

[Series 603: (id) · axial · 5.0mm · 1.47mm/px · 1 of 44 slices shown]
[im 1/44]
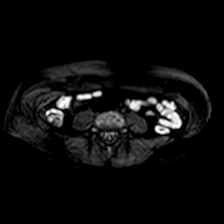

[Series 604: dadc 600 · axial · 5.0mm · 1.47mm/px · 1 of 44 slices shown]
[im 1/44]
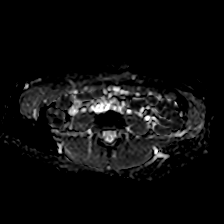

[Series 701: 3d_grase_bh · coronal · 3.0mm · 0.78mm/px · 1 of 58 slices shown]
[im 1/58]
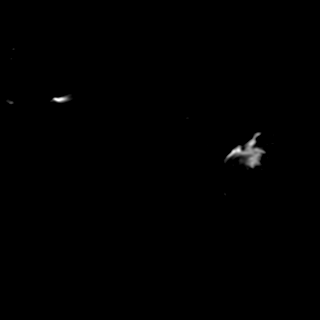

[Series 801: ssh_mrcp_radial_bh · coronal · 40.0mm · 0.59mm/px · 1 of 5 slices shown]
[im 1/5]
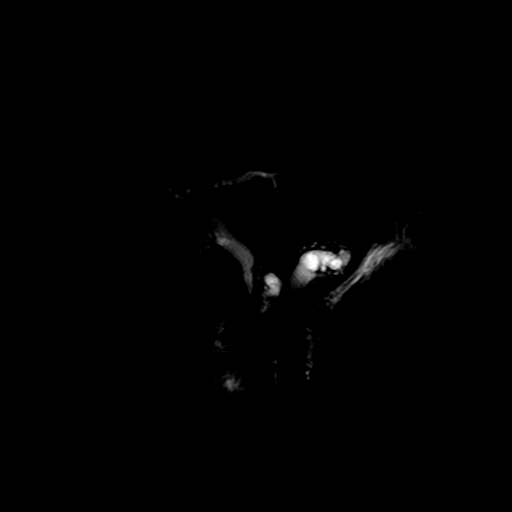

[Series 902: DIXON · axial · 4.0mm · 0.86mm/px · z∈[-62,+176]mm · 3 of 120 slices shown (1 of 5)]
[im 1/120]
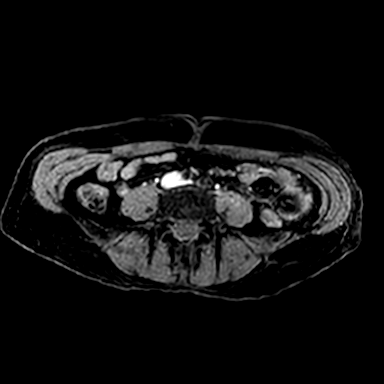
[im 60/120]
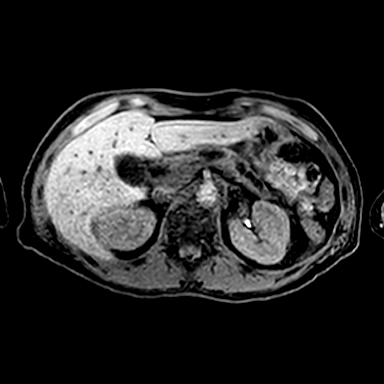
[im 120/120]
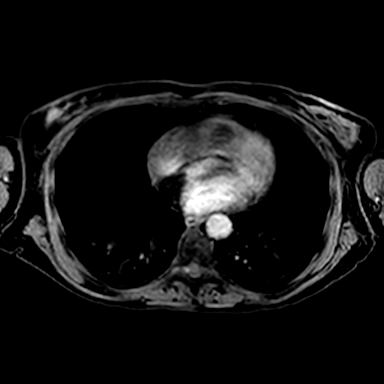

[Series 903: DIXON · axial · 4.0mm · 0.86mm/px · z∈[-62,+176]mm · 3 of 120 slices shown (2 of 5)]
[im 1/120]
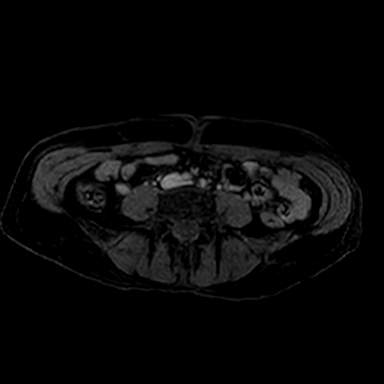
[im 60/120]
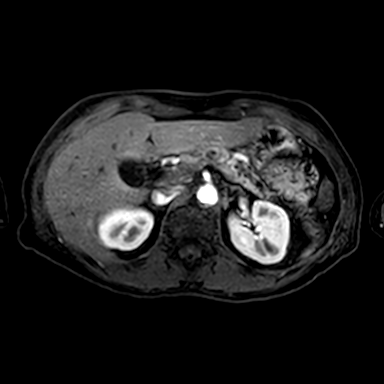
[im 120/120]
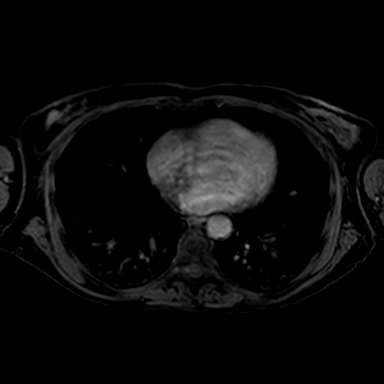

[Series 904: DIXON · axial · 4.0mm · 0.86mm/px · z∈[-62,+176]mm · 3 of 120 slices shown (3 of 5)]
[im 1/120]
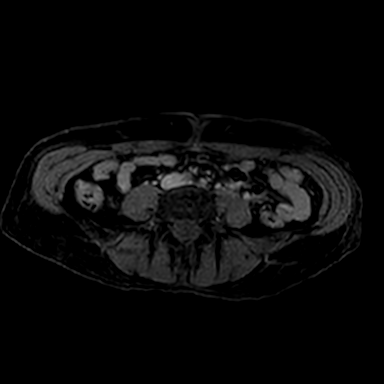
[im 60/120]
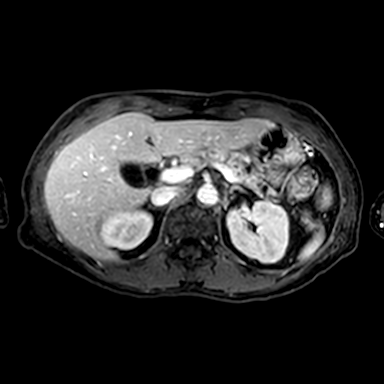
[im 120/120]
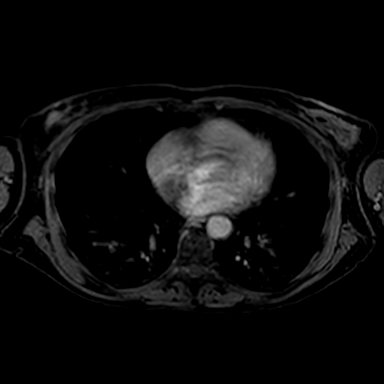

[Series 905: DIXON · axial · 4.0mm · 0.86mm/px · z∈[-62,+176]mm · 3 of 120 slices shown (4 of 5)]
[im 1/120]
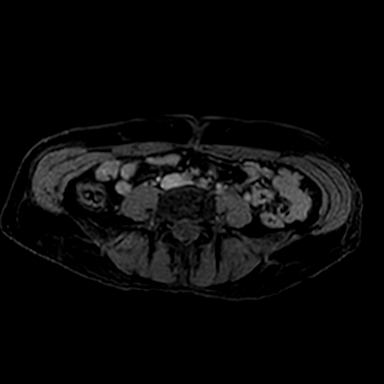
[im 60/120]
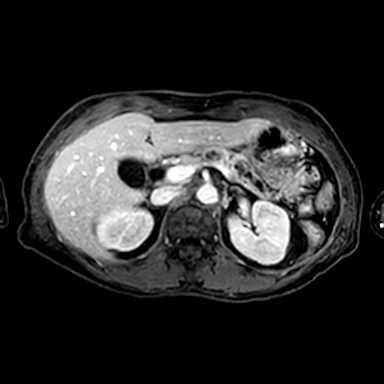
[im 120/120]
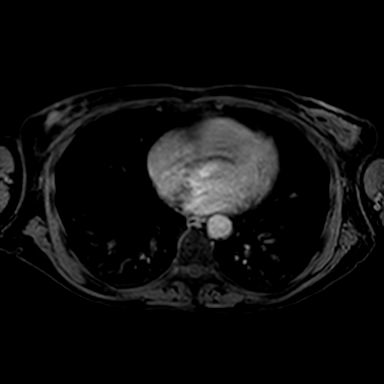

[Series 906: DIXON · axial · 4.0mm · 0.86mm/px · z∈[-62,+176]mm · 3 of 120 slices shown (5 of 5)]
[im 1/120]
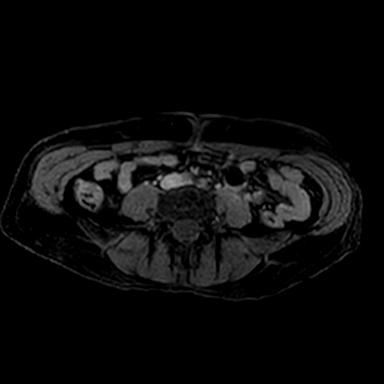
[im 60/120]
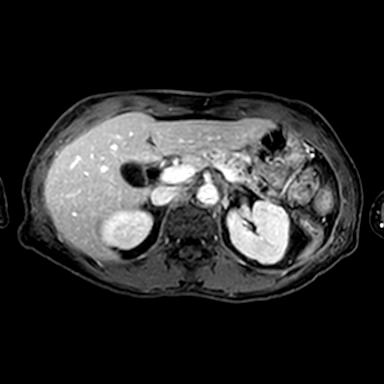
[im 120/120]
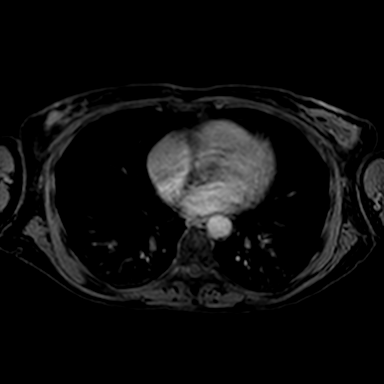

[Series 907: DIXON post-contrast · axial · 4.0mm · 0.86mm/px · z∈[-62,+176]mm · 4 of 120 slices shown (1 of 3)]
[im 1/120]
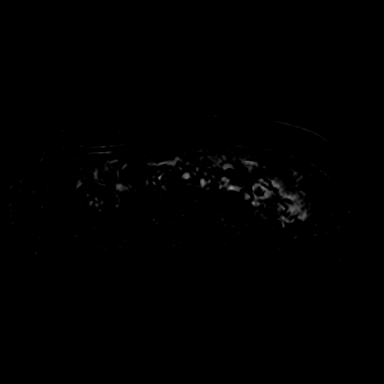
[im 40/120]
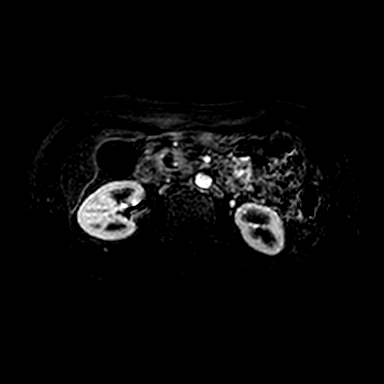
[im 80/120]
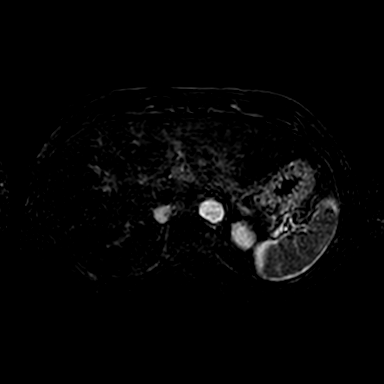
[im 120/120]
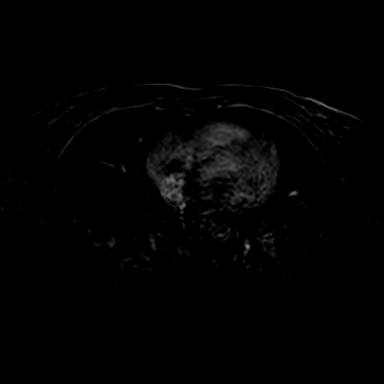

[Series 908: DIXON post-contrast · axial · 4.0mm · 0.86mm/px · z∈[-62,+176]mm · 4 of 120 slices shown (2 of 3)]
[im 1/120]
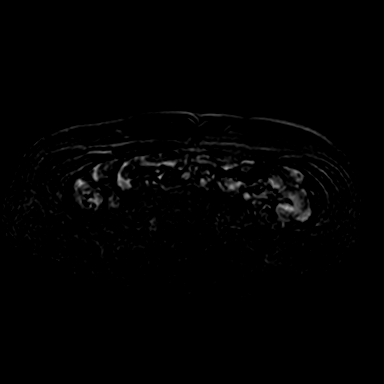
[im 40/120]
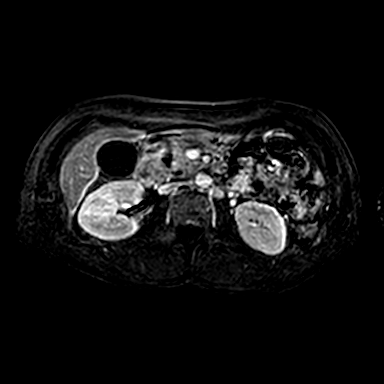
[im 80/120]
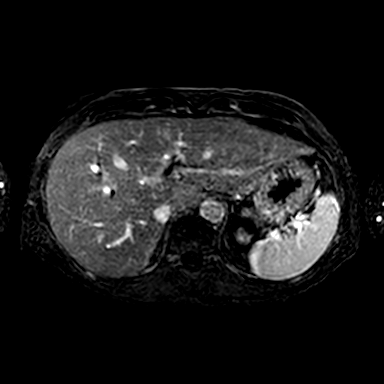
[im 120/120]
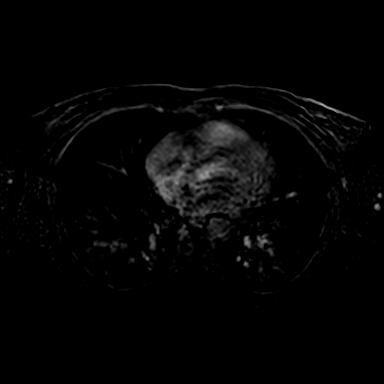

[Series 909: DIXON post-contrast · axial · 4.0mm · 0.86mm/px · 1 of 120 slices shown (3 of 3)]
[im 1/120]
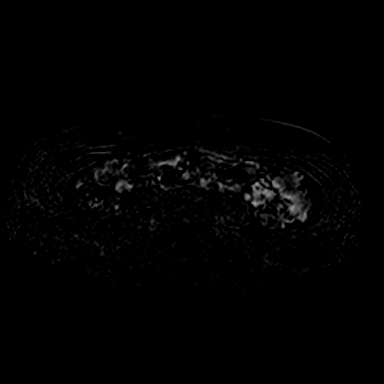

[35 of 48 positions shown; findings below may reference images not displayed]

FINDINGS: Again seen is diffuse dilatation of the main and accessory pancreatic ducts with 
associated side branch ductal prominence. The degree of main duct dilatation is 
increased now 10 mm in relation to 8.5 mm previously. No intraductal filling 
defects are found. There is no evidence for focal restricted diffusion or edema 
in the pancreas. No pancreatic mass or abnormal enhancement is found. 
The biliary tract is minimally prominent with the common hepatic duct measuring 
8 mm with normal diameter common bile duct distally. No intraductal filling 
defects. The gallbladder is unremarkable. No concerning hepatic abnormality. 
Spleen is normal in size. No significant renal abnormality. Adrenal glands 
appear normal. Abdominal aorta is normal in caliber. No suspicious abdominal 
lymph nodes. No ascites.
IMPRESSION: Persistent and increasing diffuse irregular pancreatic ductal dilatation with 
prominent side branch ductules most likely indicating chronic pancreatitis. No 
acute findings.

## 2022-08-29 IMAGING — MR MRI THORACIC SPINE WITHOUT CONTRAST
5 of 9 series · 19 of 48 positions shown · non-contrast
Comparison: None

________________________________________________________________________________________________ 
MRI THORACIC SPINE WITHOUT CONTRAST, 08/29/2022 [DATE]: 
CLINICAL INDICATION: Thoracic radiculopathy, chronic pain
TECHNIQUE: Sagittal T1, Sagittal T2, Sagittal STIR, Axial T2 and Axial T1 MR 
images of the thoracic were performed without intravenous contrast enhancement.

[Series 101: t2_sag_count · sagittal · 4.0mm · 0.62mm/px · 2 of 22 slices shown]
[im 1/22]
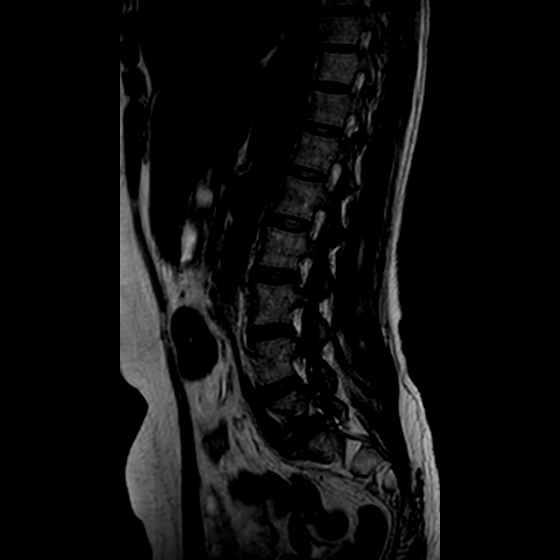
[im 22/22]
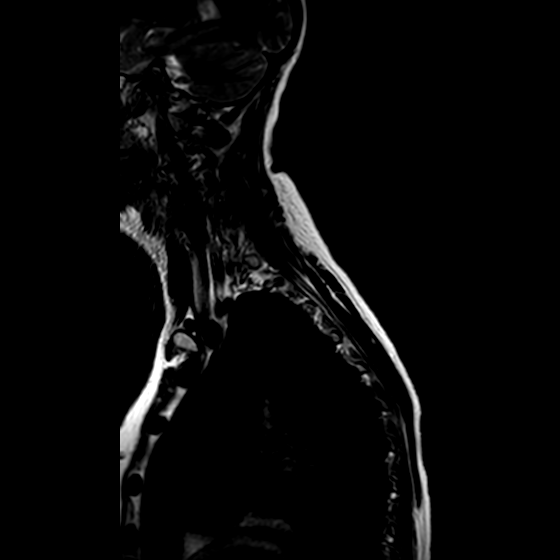

[Series 102: T2 · sagittal · 4.0mm · 0.62mm/px · 2 of 11 slices shown (1 of 2)]
[im 1/11]
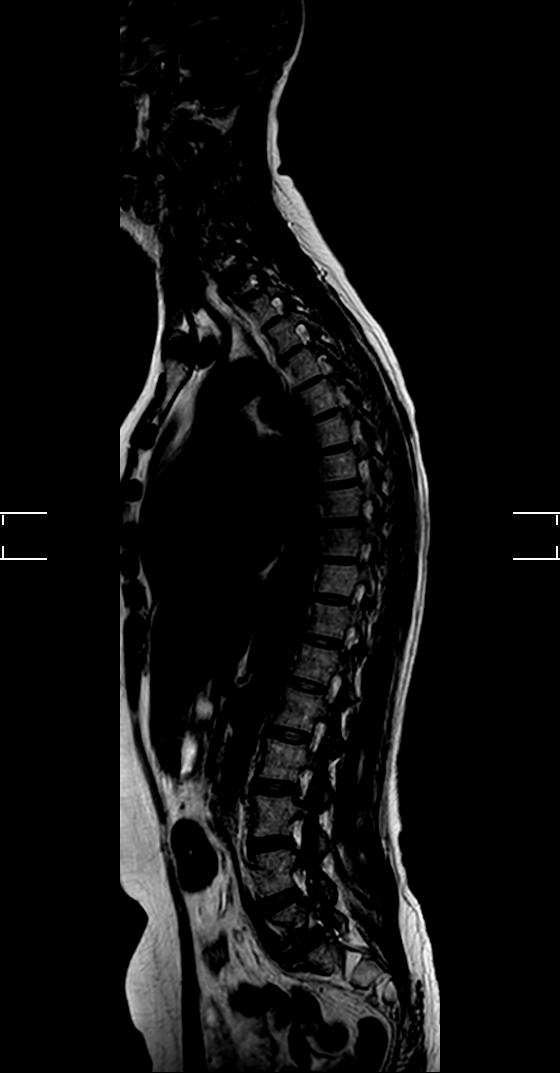
[im 11/11]
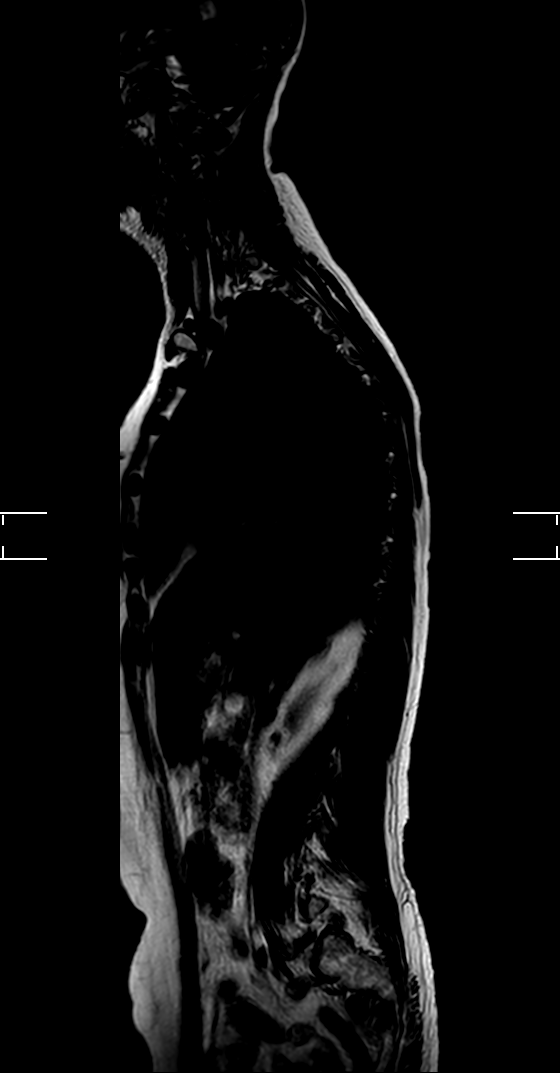

[Series 201: t2_cor_count · coronal · 4.0mm · 0.61mm/px · 1 of 15 slices shown]
[im 1/15]
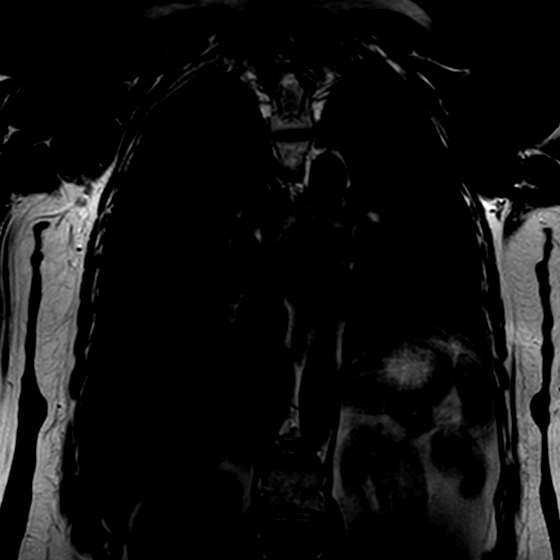

[Series 301: T1 · sagittal · 3.0mm · 0.53mm/px · 3 of 21 slices shown]
[im 1/21]
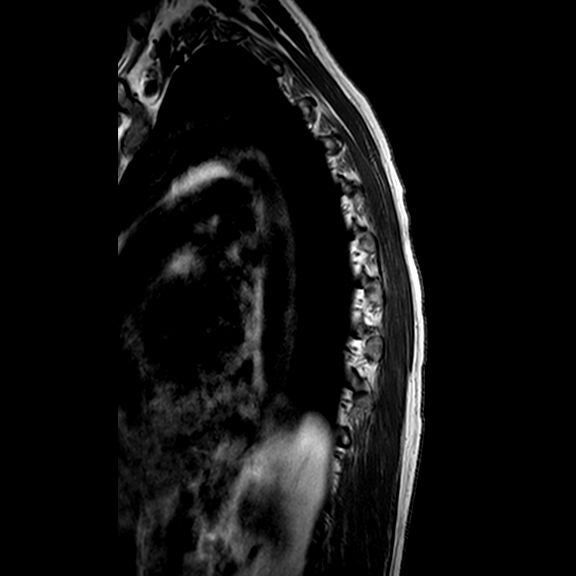
[im 11/21]
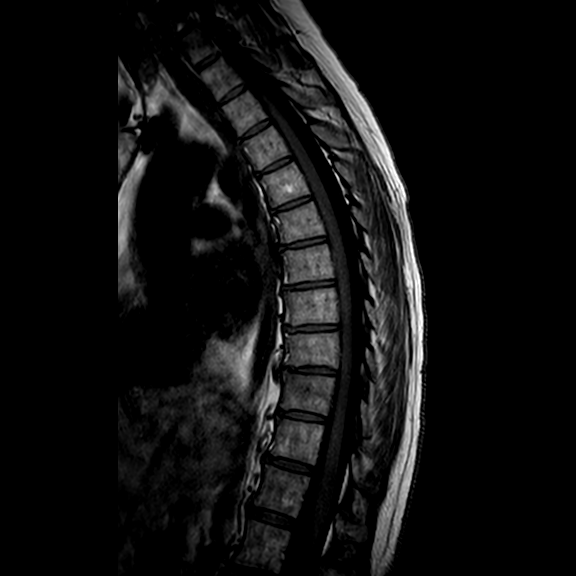
[im 21/21]
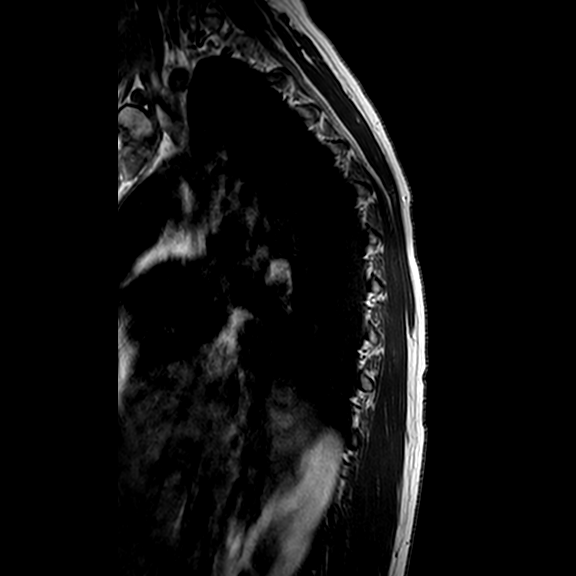

[Series 601: T2 · axial · 4.0mm · 0.42mm/px · z∈[-251,-10]mm · 11 of 75 slices shown (2 of 2)]
[im 1/75]
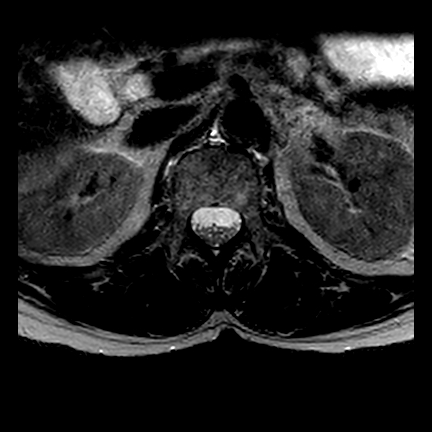
[im 8/75]
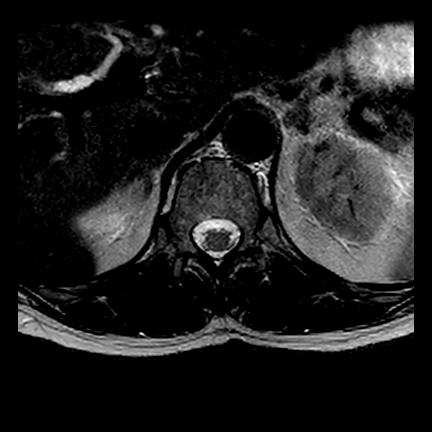
[im 15/75]
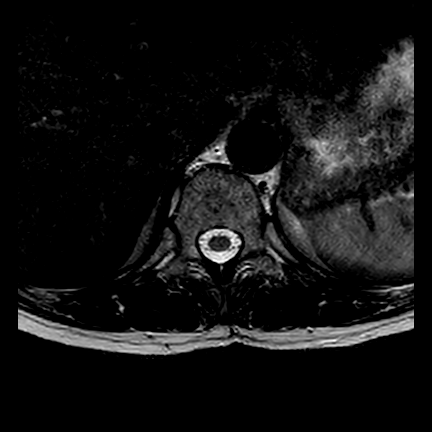
[im 23/75]
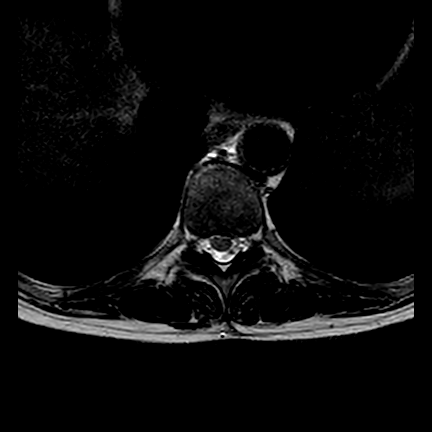
[im 30/75]
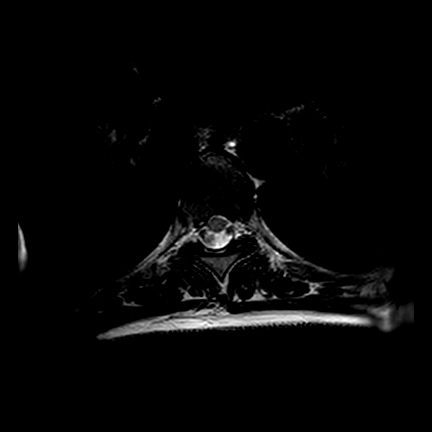
[im 38/75]
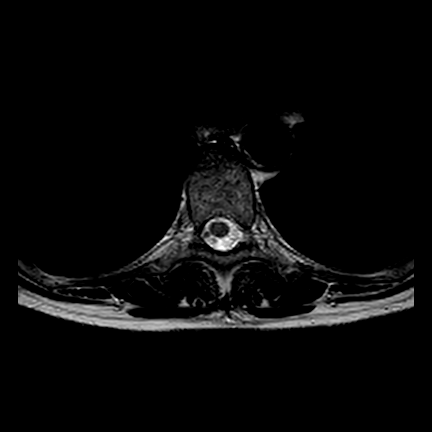
[im 45/75]
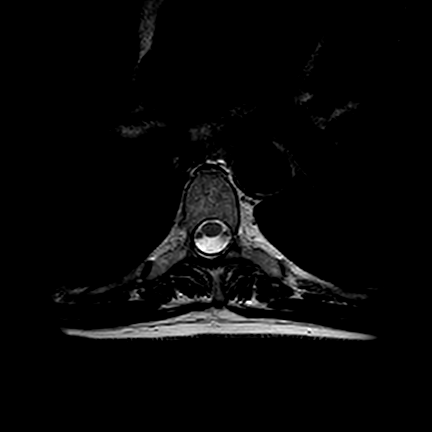
[im 52/75]
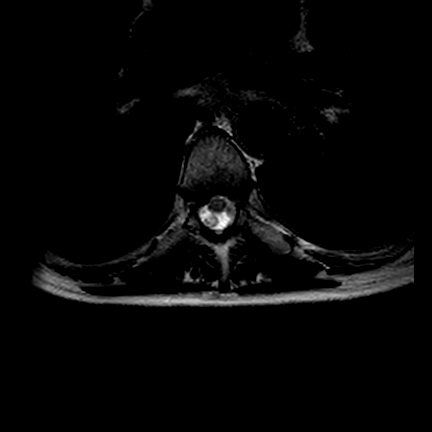
[im 60/75]
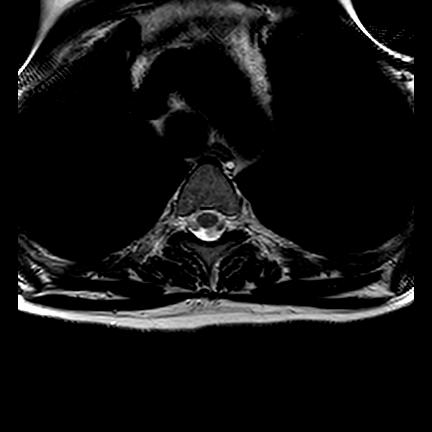
[im 67/75]
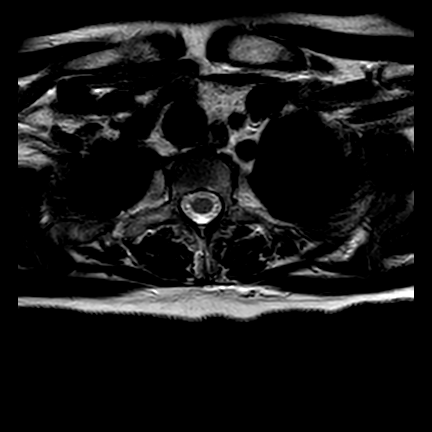
[im 75/75]
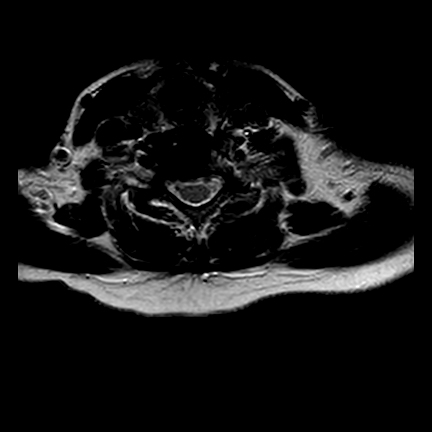

[19 of 48 positions shown; findings below may reference images not displayed]

FINDINGS: Thoracic vertebral heights are intact. No Modic type I change. No 
evidence for fracture or malignancy. 
Motion artifact limits detail. 
There appears to be a midline disc extrusion extending superiorly several 
millimeters opposite T9-T10, touching the cord without deformity or significant 
stenosis. 
 At T7-8 there is a left posterolateral disc extrusion slightly deforming the 
cord, measuring 7 mm vertically, 4 mm AP dimension on 1.4 mm sagittal image 51, 
also seen on 1 mm axial image 95. 
 At T6-7 there is a right posterolateral disc extrusion touching the right 
lateral cord, seen on 1 mm axial image 109. This measures 5 x 3 mm. No 
significant stenosis at this level. 
Within study limitations no other significant disc contour abnormality. Conus 
terminates at T12-L1. There is no cord signal abnormality. 
No layering pleural fluid. No paraspinal mass.
IMPRESSION: Disc extrusions as described at T9-T10, T7-8 and T6-7. These are all potential 
sources of discogenic pain. 
No evidence for fracture or significant thoracic canal stenosis.. Cord signal 
appears normal.  
Mild thoracic levocurvature.

## 2022-11-13 IMAGING — MG MAMMOGRAPHY SCREENING BILATERAL 3[PERSON_NAME]
8 series · 8 of 24 positions shown · non-contrast
Comparison: Comparison was made to prior examinations.

________________________________________________________________________________________________ 
MAMMOGRAPHY SCREENING BILATERAL 3[facility]PP[facility], 11/13/2022 [DATE]: 
CLINICAL INDICATION: Encounter for screening mammogram.
TECHNIQUE: Digital bilateral mammograms and 3-D Tomosynthesis were obtained. 
These were interpreted both primarily and with the aid of computer-aided 
detection system.  
BREAST DENSITY: (Level B) There are scattered areas of fibroglandular density.

[R MLO]
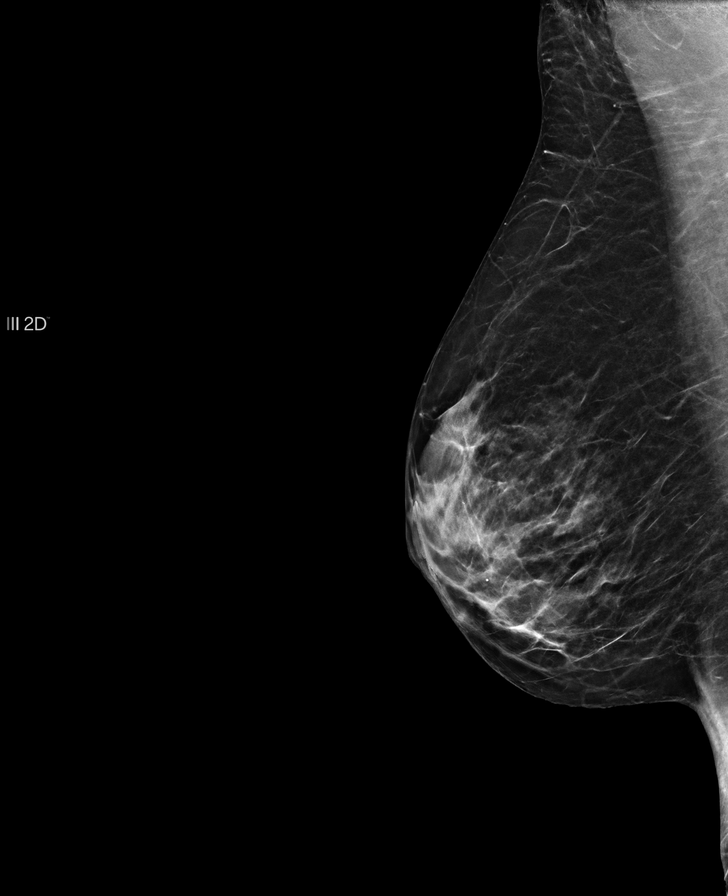

[L MLO]
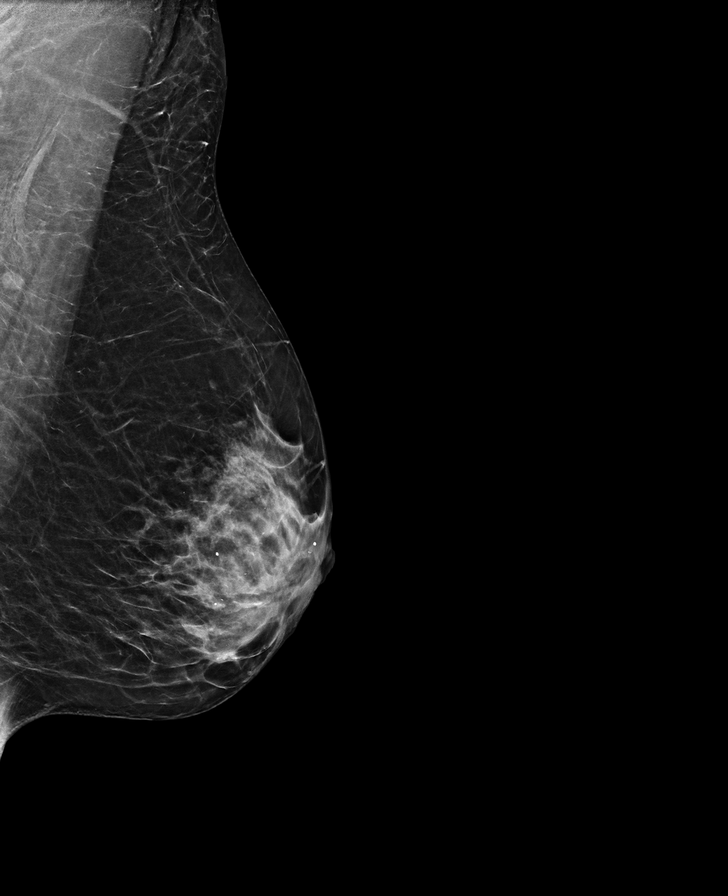

[L CC]
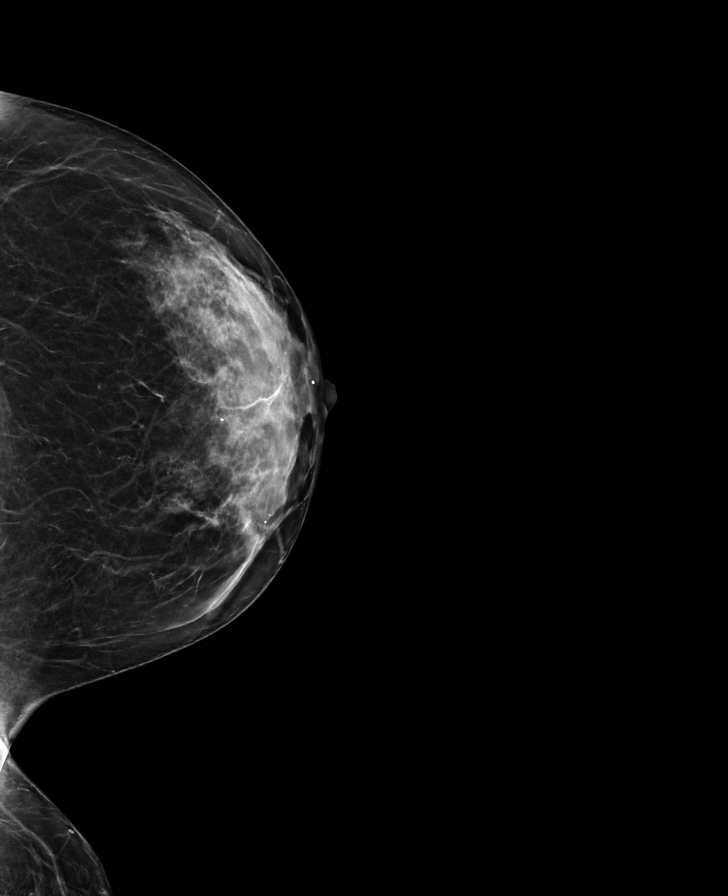

[R CC]
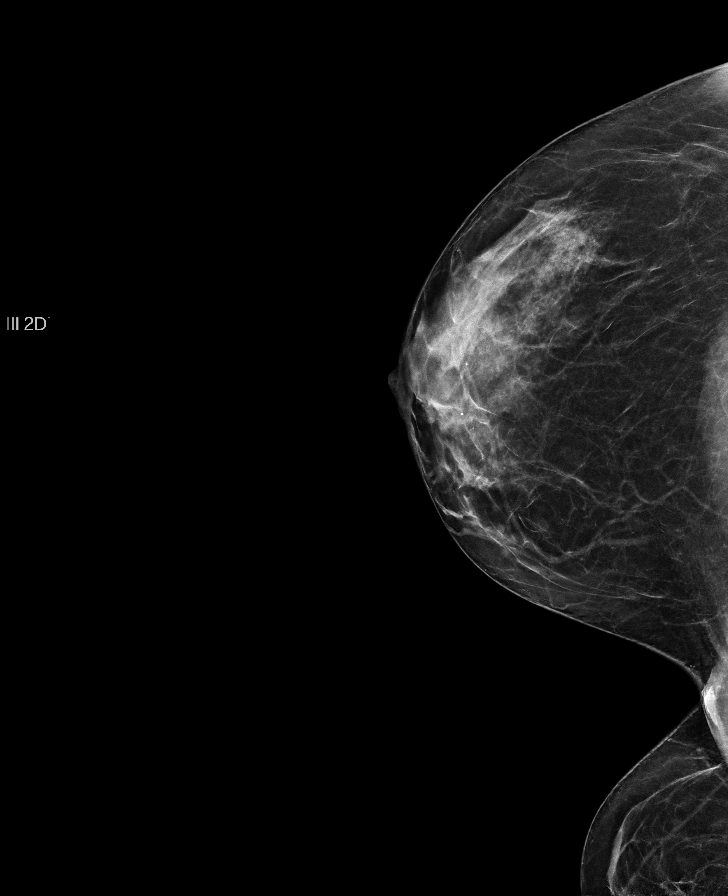

[R CC tomo · tomo slice 27/54.0]
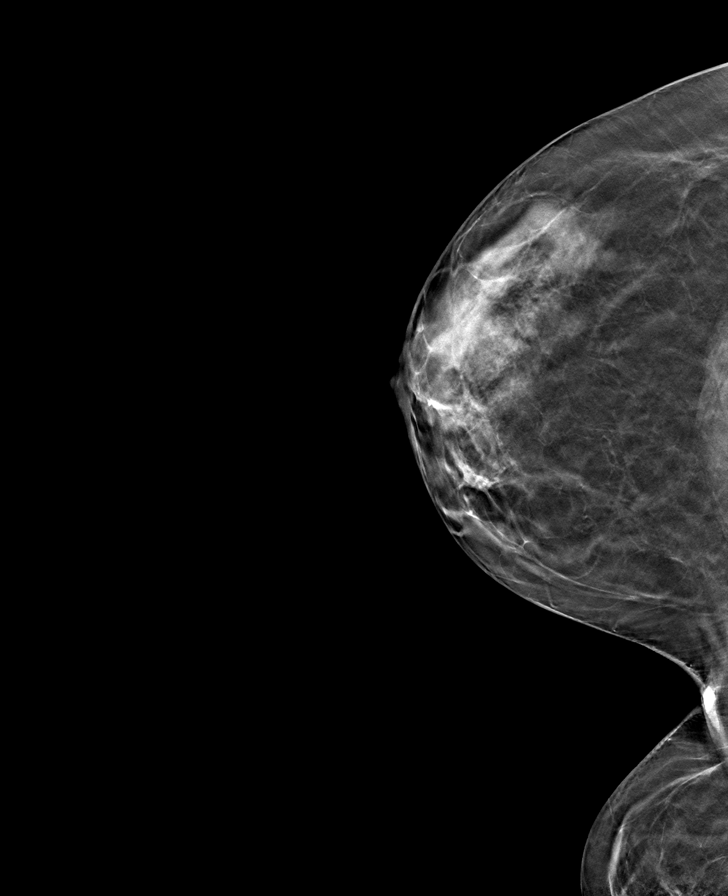

[L MLO tomo · tomo slice 29/58.0]
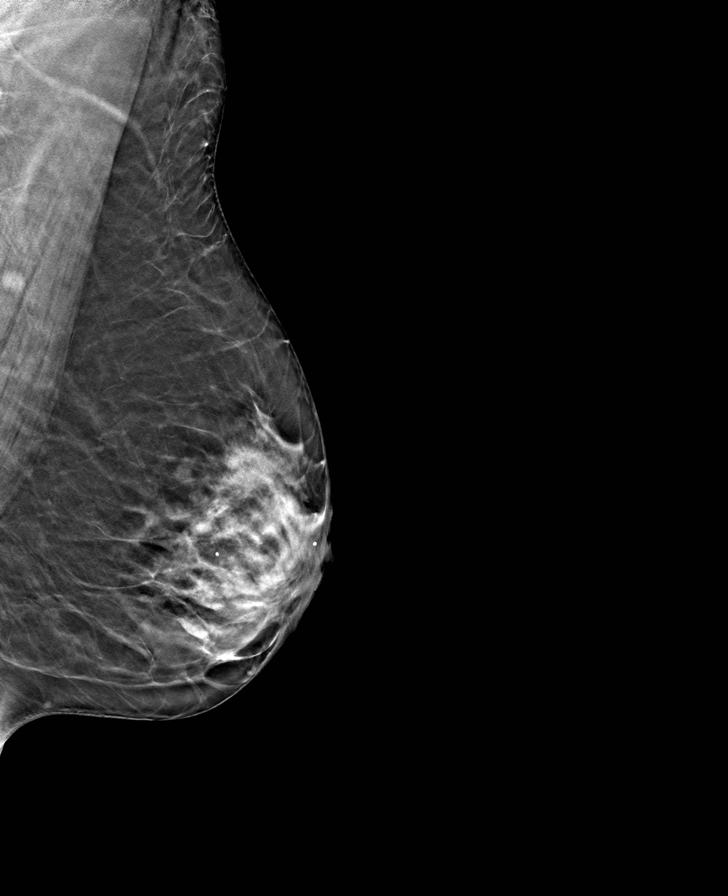

[L CC tomo · tomo slice 27/53.0]
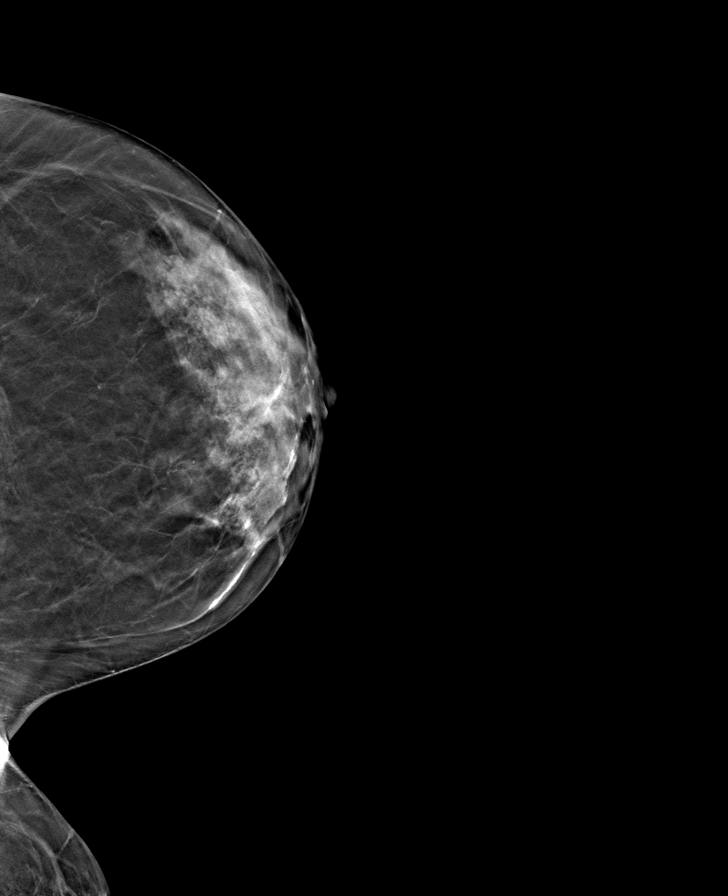

[R MLO tomo · tomo slice 28/55.0]
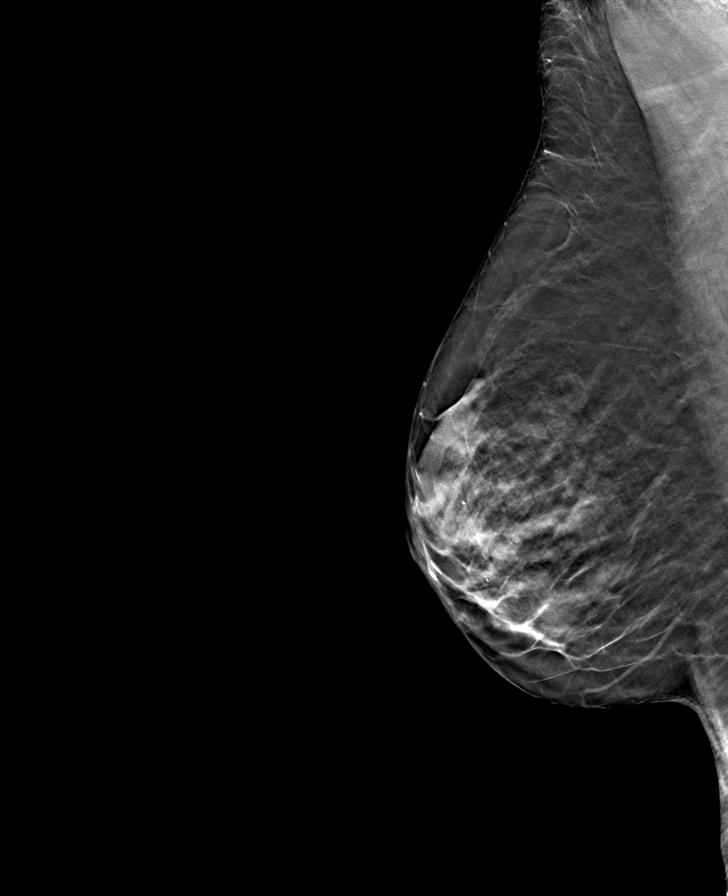

[8 of 24 positions shown; findings below may reference images not displayed]

FINDINGS: No suspicious mass, calcifications, or area of architectural 
distortion in either breast.
IMPRESSION: Stable mammogram. 
(BI-RADS 2) Benign findings. Routine mammographic follow-up is recommended.

## 2022-11-19 IMAGING — MR MRI LUMBAR SPINE WITHOUT CONTRAST
7 of 9 series · 13 of 48 positions shown · IV contrast (gadolinium)
Comparison: None

________________________________________________________________________________________________ 
MRI LUMBAR SPINE WITHOUT CONTRAST, 11/19/2022 [DATE]: 
CLINICAL INDICATION: Radiculopathy, Lumbosacral Region
TECHNIQUE: Multiplanar, multiecho position MR images of the lumbar spine were 
performed without intravenous gadolinium enhancement. Patient was scanned on a 
1.5T magnet

[Series 101: survey · axial · 10.0mm · 1.25mm/px · z∈[-33,+201]mm · 2 of 10 slices shown]
[im 1/10]
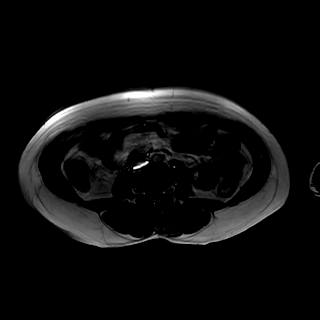
[im 10/10]
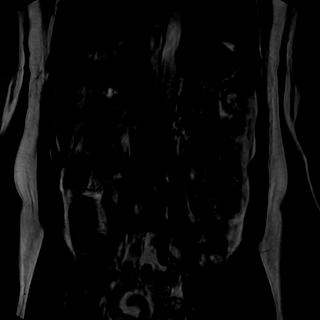

[Series 201: t2w_cor-surv · coronal · 6.0mm · 0.62mm/px · 2 of 10 slices shown]
[im 1/10]
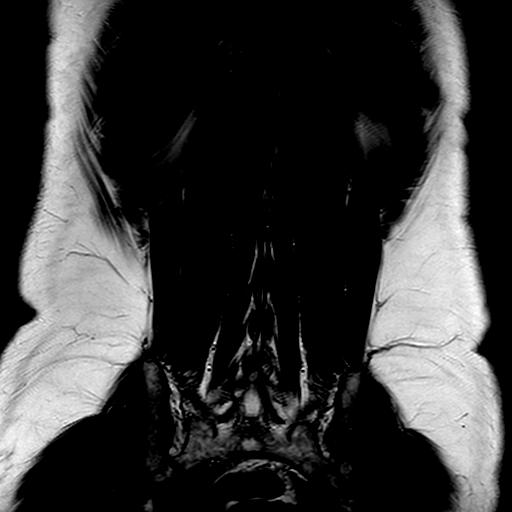
[im 10/10]
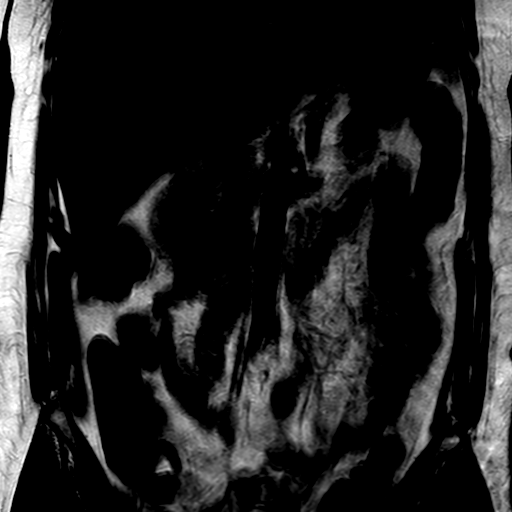

[Series 301: t1_tse_sag · sagittal · 4.0mm · 0.34mm/px · 2 of 19 slices shown]
[im 1/19]
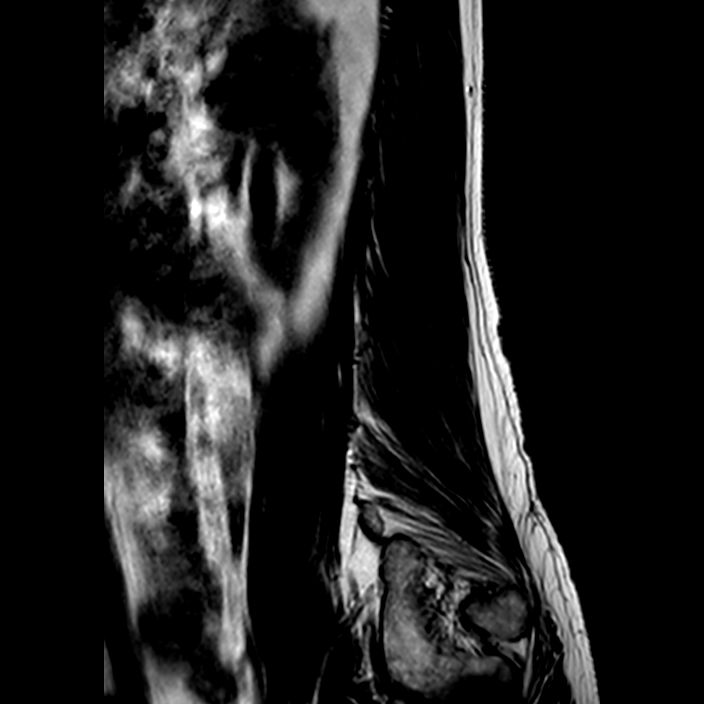
[im 19/19]
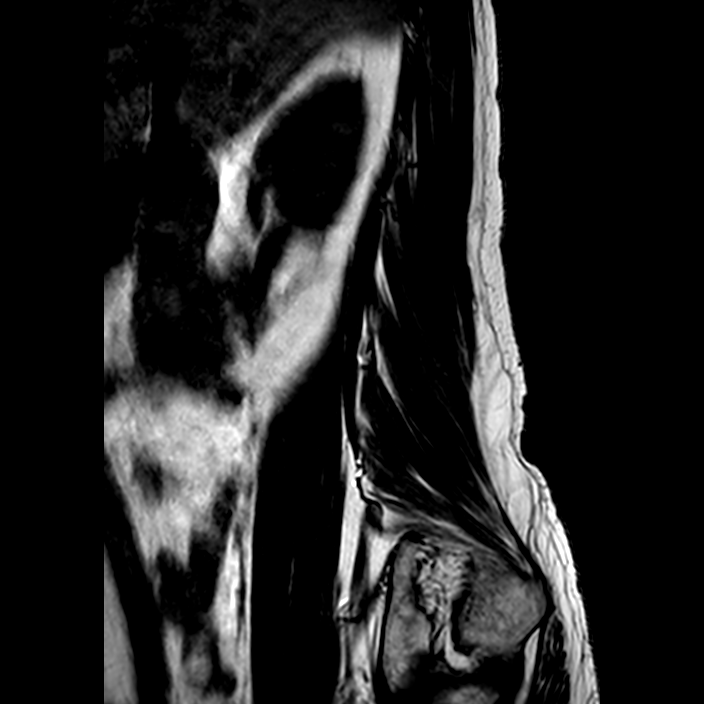

[Series 402: (id)_mdixon_tse · sagittal · 4.0mm · 0.51mm/px · 2 of 19 slices shown]
[im 1/19]
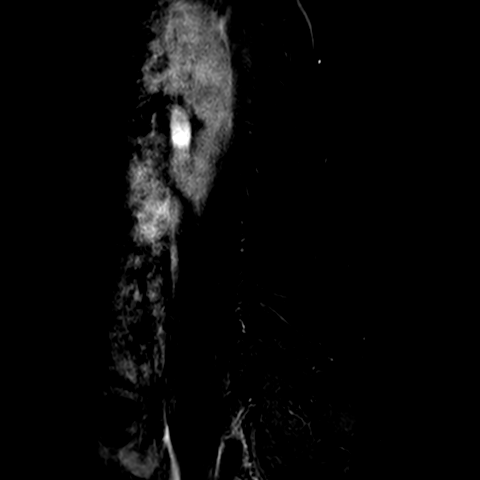
[im 19/19]
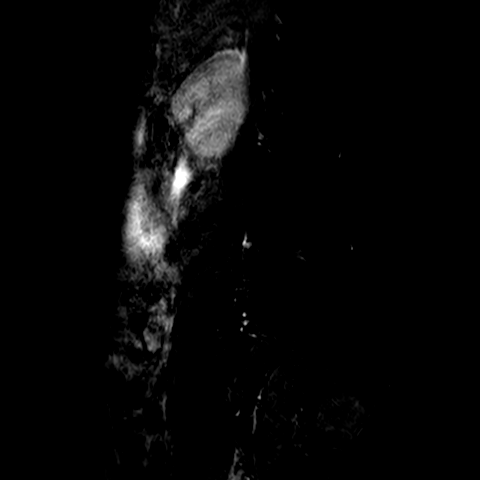

[Series 403: st2w_mdixon_tse · sagittal · 4.0mm · 0.51mm/px · 2 of 19 slices shown]
[im 1/19]
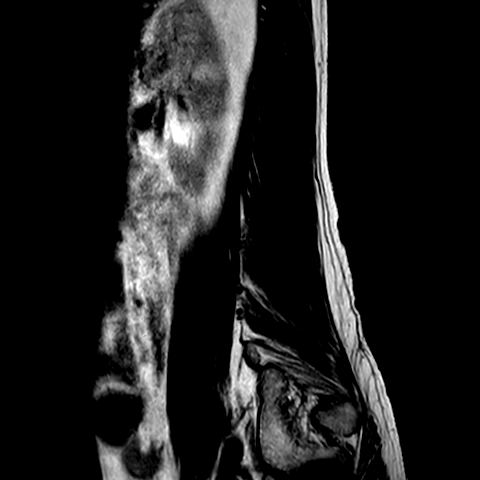
[im 19/19]
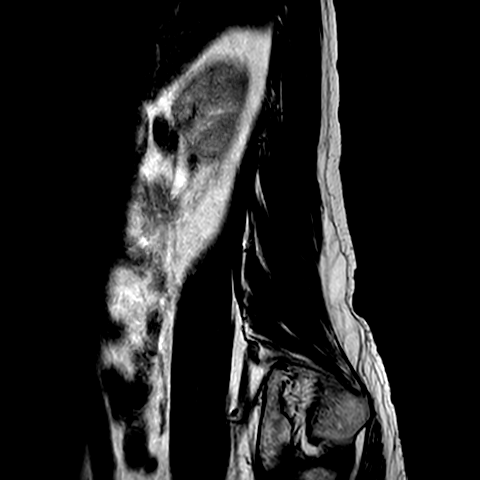

[Series 502: (id) view_ax mpr · axial · 1.0mm · 0.25mm/px · 1 of 131 slices shown]
[im 9/131]
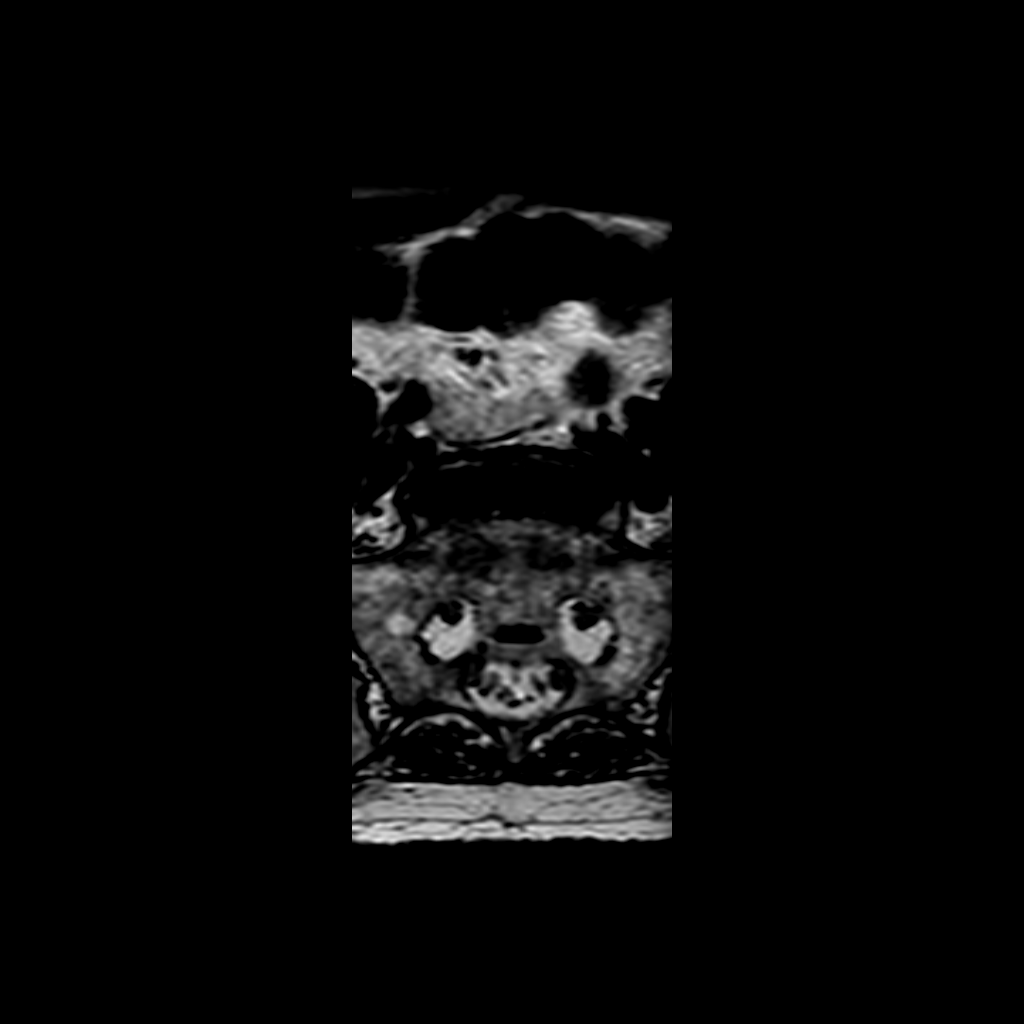

[Series 601: STIR · sagittal · 4.0mm · 0.47mm/px · 2 of 19 slices shown]
[im 1/19]
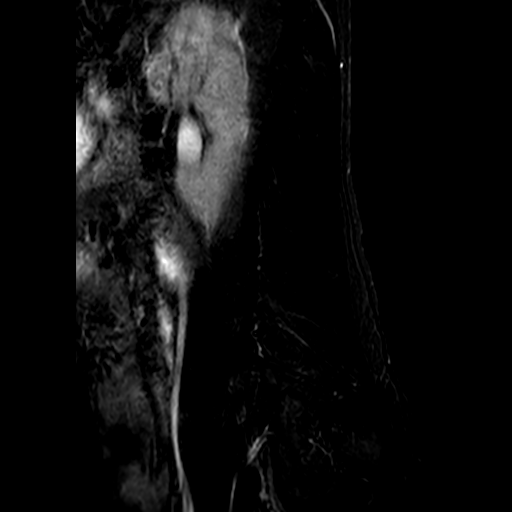
[im 19/19]
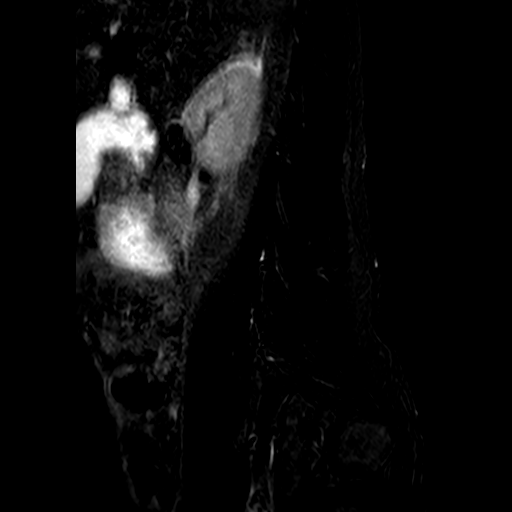

[13 of 48 positions shown; findings below may reference images not displayed]

FINDINGS: GENERAL: 
Nomenclature is based on 5 lumbar type vertebral bodies.     
ALIGNMENT: Normal. 
VERTEBRAE: No fracture. Multilevel osteophytes.  
MARROW SIGNAL: No focal suspect signal abnormality. 
CORD SIGNAL: Normal distal spinal cord and cauda equina. Conus medullaris 
terminates at L1. 
EXTRASPINAL STRUCTURES: Unremarkable. 
Modic I-II: Type II Modic changes at L5-S1. 
Ligamentum Flavum > 2.5 mm: All levels 
SEGMENTAL: 
T12-L1: Normal disc height and signal. No herniation. Normal facets. No spinal 
canal or neural foraminal stenosis. 
L1-L2: Normal disc height and signal. No herniation. Normal facets. No spinal 
canal or neural foraminal stenosis. 
L2-L3: Normal disc height and signal. No herniation. Normal facets. No spinal 
canal or neural foraminal stenosis. 
L3-L4: Normal disc height and signal. No herniation. Normal facets. No spinal 
canal or neural foraminal stenosis. 
L4-L5: Normal disc height and signal. No herniation. Normal facets. No spinal 
canal or neural foraminal stenosis. 
L5-S1: Left paracentral disc protrusion measures 0.4 cm in AP dimensions and
cm in width, resulting in mild left lateral recess stenosis. The disc protrusion 
is superimposed upon disc bulge. Marked disc space narrowing and disc 
desiccation. Normal facets. No central canal stenosis. Mild neural foraminal 
stenosis. 
IMPRESSION 
L5-S1 marked degenerative change, left paracentral disc protrusion and disc 
bulge result in mild left lateral recess and bilateral neural foraminal 
stenosis.

## 2023-08-22 IMAGING — MR MRI CERVICAL SPINE W/WO CONTRAST
7 of 11 series · 32 of 48 positions shown · IV contrast (gadavist)
Comparison: MRI thoracic spine August 29, 2022.

________________________________________________________________________________________________ 
MRI CERVICAL SPINE W/WO CONTRAST, 08/22/2023 [DATE]: 
CLINICAL INDICATION: Radiculopathy, Cervical Region , chronic neck pain with 
bilateral upper extremity numbness. Recent fall. Cervical discectomy.
TECHNIQUE: Multiplanar, multiecho position MR images of the cervical spine were 
performed without and with 4.5 mL of Gadavist injected intravenously by hand. 3 
mL of Gadavist discarded.

[Series 201: survey · axial · 10.0mm · 1.25mm/px · z∈[+23,+209]mm · 3 of 10 slices shown]
[im 1/10]
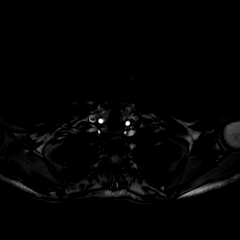
[im 5/10]
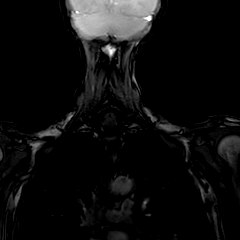
[im 10/10]
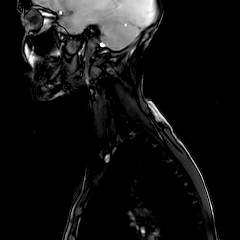

[Series 301: t2w_cor-surv · coronal · 5.0mm · 0.69mm/px · 2 of 7 slices shown]
[im 1/7]
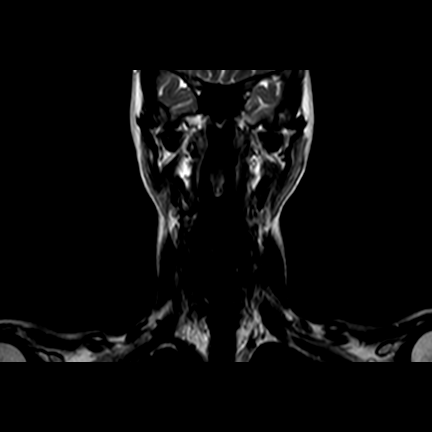
[im 7/7]
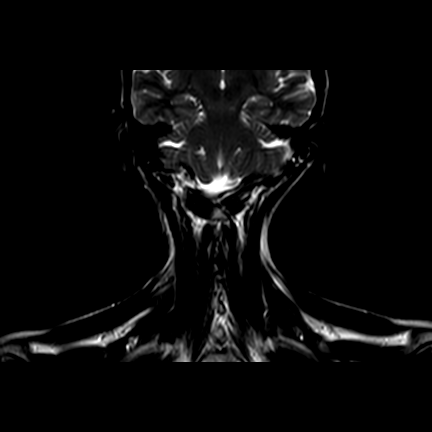

[Series 401: T1 · sagittal · 3.0mm · 0.39mm/px · 3 of 15 slices shown (1 of 2)]
[im 1/15]
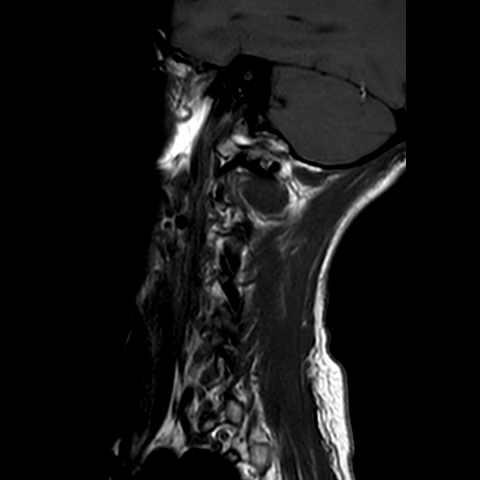
[im 8/15]
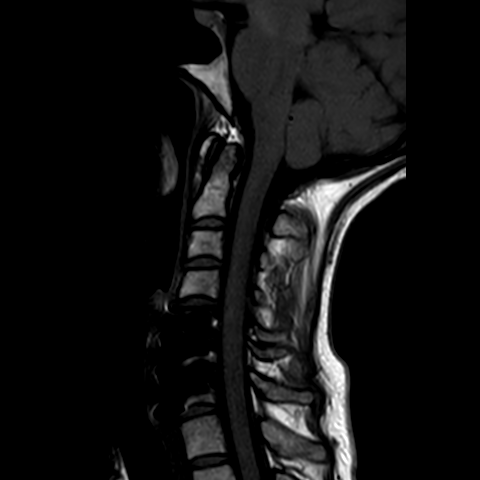
[im 15/15]
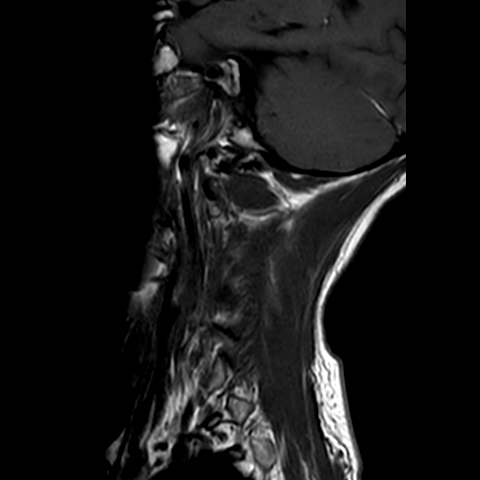

[Series 502: (id)_mdixon_tse · sagittal · 3.0mm · 0.42mm/px · 3 of 15 slices shown]
[im 1/15]
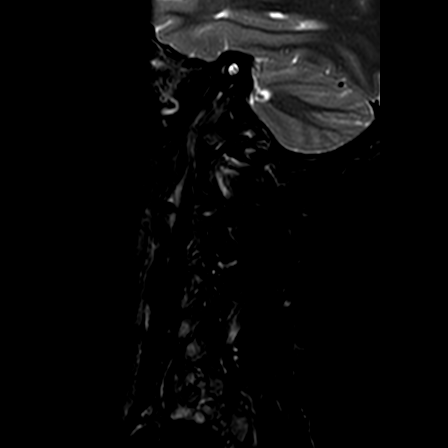
[im 8/15]
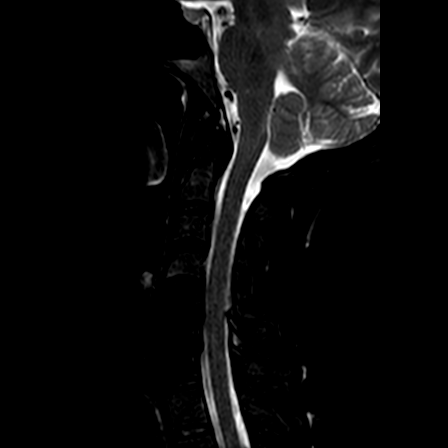
[im 15/15]
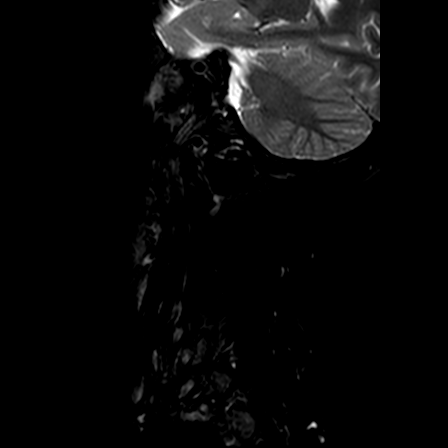

[Series 701: T2 · axial · 3.0mm · 0.31mm/px · z∈[+8,+100]mm · 7 of 31 slices shown]
[im 1/31]
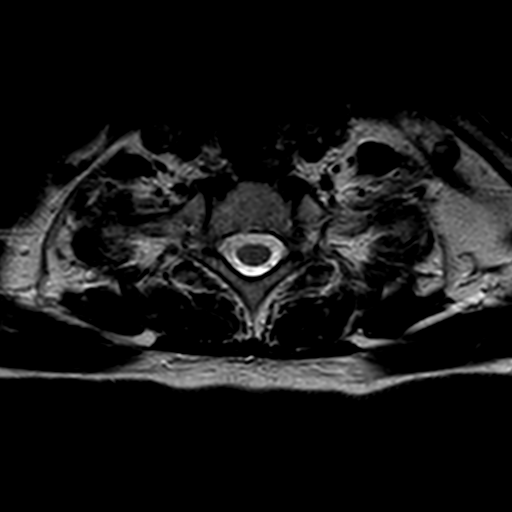
[im 6/31]
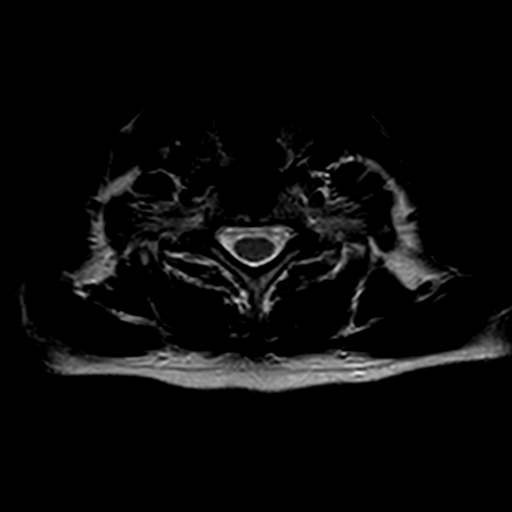
[im 11/31]
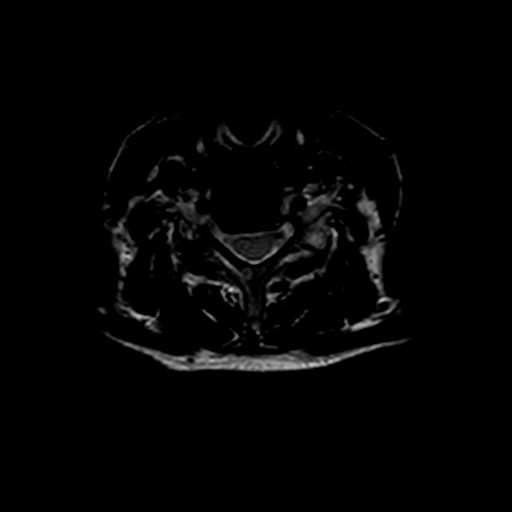
[im 16/31]
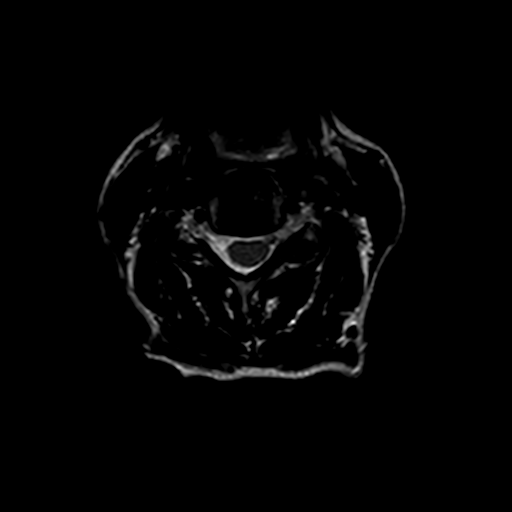
[im 21/31]
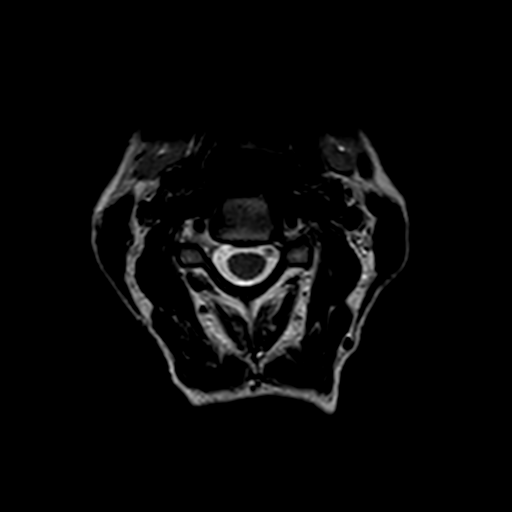
[im 26/31]
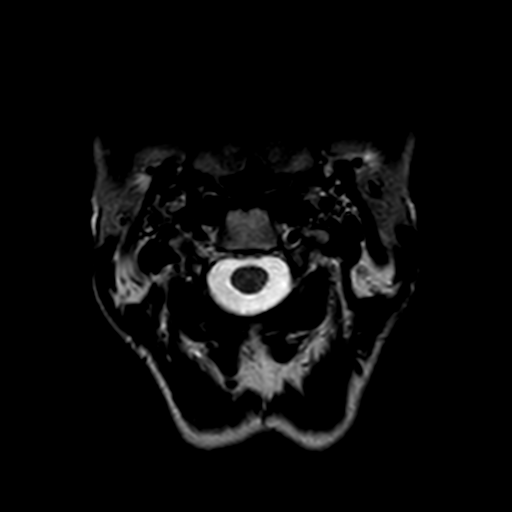
[im 31/31]
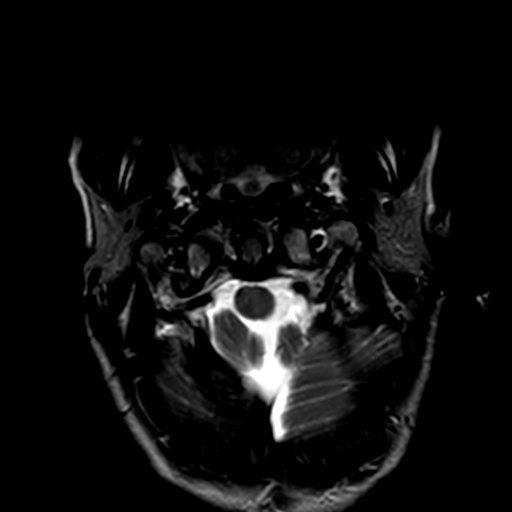

[Series 801: T1 · axial · 3.0mm · 0.59mm/px · z∈[+8,+104]mm · 7 of 33 slices shown (2 of 2)]
[im 1/33]
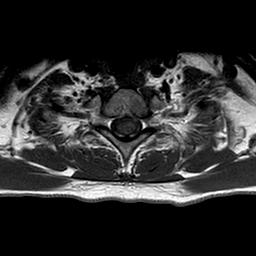
[im 6/33]
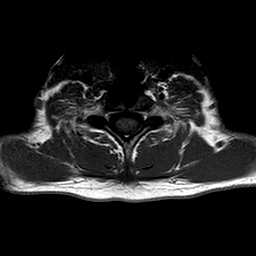
[im 11/33]
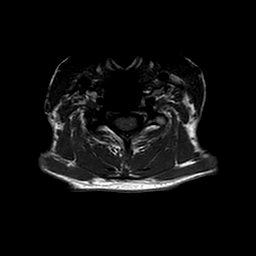
[im 17/33]
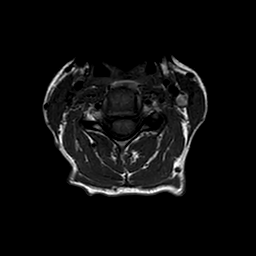
[im 22/33]
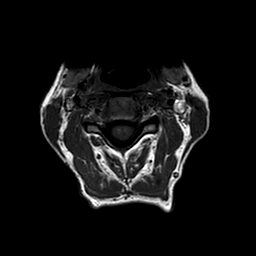
[im 27/33]
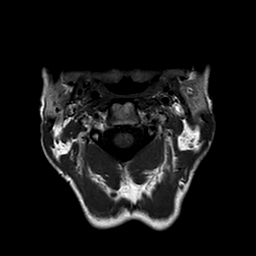
[im 33/33]
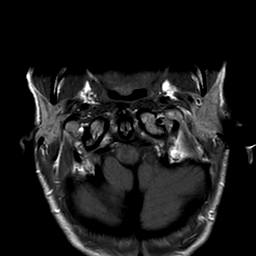

[Series 1001: T1 post-contrast · axial · 3.0mm · 0.59mm/px · z∈[+5,+107]mm · 7 of 35 slices shown]
[im 1/35]
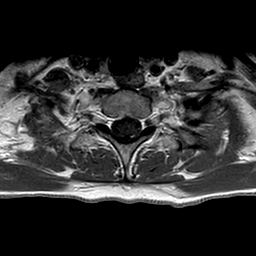
[im 6/35]
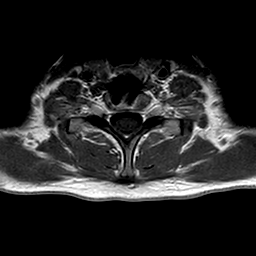
[im 12/35]
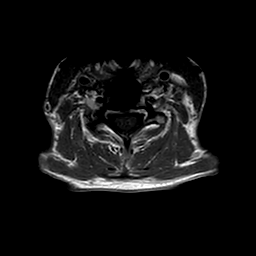
[im 18/35]
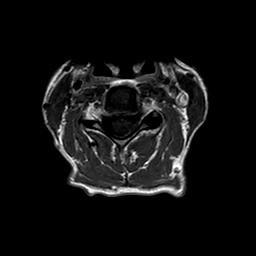
[im 23/35]
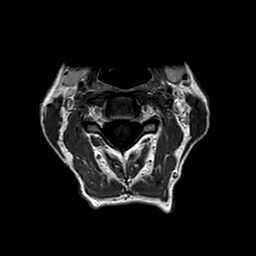
[im 29/35]
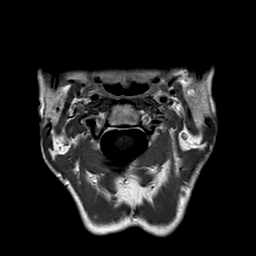
[im 35/35]
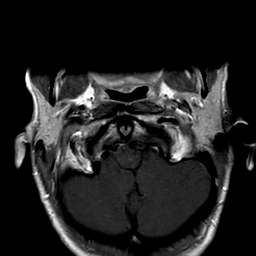

[32 of 48 positions shown; findings below may reference images not displayed]

FINDINGS: C5-C7 ACDF. 
-------------------------------------------------------------------------------- 
----------------- 
GENERAL: 
ALIGNMENT: Minimal dextroconvex cervical scoliosis. Grade 1 retrolisthesis C4 on 
C5. 
VERTEBRAL BODY HEIGHT: Normal.  
MARROW SIGNAL: No focal suspect signal abnormality. 
CORD SIGNAL: Normal.  No pathologic enhancement. 
ADDITIONAL FINDINGS: Right cerebellar tonsil is mildly low-lying.. 
-------------------------------------------------------------------------------- 
---------------- 
SEGMENTAL:  
CRANIOCERVICAL JUNCTION: No significant stenosis. 
C2-C3: Normal disc height. No herniation. Normal facets. No spinal canal or 
neural foraminal stenosis. 
C3-C4: Normal disc height. Patent canal and right foramen. Mild left foraminal 
narrowing from left-sided uncinate spurring and left-sided facet arthropathy. 
C4-C5: Slight loss of disc height. Ventral cord flattening with borderline canal 
stenosis. Right foramen patent. Left foramen patent. The right C5 superior 
articular facet appears truncated. 
C5-C6: Fusion. Mild canal stenosis with ventral cord flattening. Bilateral 
uncinate spurring more prominent on the right with mild right foraminal 
narrowing. Mild left foraminal narrowing. Facet arthropathy. 
C6-C7: Fusion. Borderline canal stenosis with ventral cord flattening. Bilateral 
uncinate spurring with mild to moderate right and moderate left foraminal 
narrowing. 
C7-T1: Normal disc height. No herniation. Normal facets. No spinal canal or 
neural foraminal stenosis. 
-------------------------------------------------------------------------------- 
---------------
IMPRESSION: Postsurgical changes as above. 
Ventral cord flattening noted C4-C5 through C6-C7. 
Most significant foraminal narrowing at C6-C7.
